# Patient Record
Sex: Female | Born: 1945 | Race: White | Hispanic: No | Smoking: Never smoker
Health system: Southern US, Community
[De-identification: ages and names within clinical notes are randomized; demographics above are authoritative.]

## PROBLEM LIST (undated history)

## (undated) DIAGNOSIS — I10 Essential (primary) hypertension: Secondary | ICD-10-CM

## (undated) HISTORY — PX: BREAST SURGERY: SHX581

---

## 2016-10-25 ENCOUNTER — Emergency Department (HOSPITAL_BASED_OUTPATIENT_CLINIC_OR_DEPARTMENT_OTHER)
Admission: EM | Admit: 2016-10-25 | Discharge: 2016-10-25 | Disposition: A | Discharge status: Home / Self Care | Payer: Medicare HMO | Attending: Emergency Medicine | Admitting: Emergency Medicine

## 2016-10-25 ENCOUNTER — Encounter (HOSPITAL_BASED_OUTPATIENT_CLINIC_OR_DEPARTMENT_OTHER): Payer: Self-pay | Admitting: Emergency Medicine

## 2016-10-25 DIAGNOSIS — R040 Epistaxis: Secondary | ICD-10-CM | POA: Insufficient documentation

## 2016-10-25 DIAGNOSIS — Z79899 Other long term (current) drug therapy: Secondary | ICD-10-CM | POA: Insufficient documentation

## 2016-10-25 MED ORDER — SILVER NITRATE-POT NITRATE 75-25 % EX MISC
CUTANEOUS | Status: AC
  Administered 2016-10-25: 2 via TOPICAL
  Filled 2016-10-25: qty 1

## 2016-10-25 MED ORDER — OXYMETAZOLINE HCL 0.05 % NA SOLN
1.0000 | Freq: Once | NASAL | Status: AC
  Administered 2016-10-25: 1 via NASAL
  Filled 2016-10-25: qty 15

## 2016-10-25 MED ORDER — SILVER NITRATE-POT NITRATE 75-25 % EX MISC
2.0000 | Freq: Once | CUTANEOUS | Status: AC
  Administered 2016-10-25: 2 via TOPICAL

## 2016-10-25 MED ORDER — SILVER NITRATE-POT NITRATE 75-25 % EX MISC
1.0000 | Freq: Once | CUTANEOUS | Status: AC
  Administered 2016-10-25: 1 via TOPICAL
  Filled 2016-10-25: qty 1

## 2016-10-25 MED ORDER — ONDANSETRON 4 MG PO TBDP
4.0000 mg | ORAL_TABLET | Freq: Once | ORAL | Status: AC
  Administered 2016-10-25: 4 mg via ORAL
  Filled 2016-10-25: qty 1

## 2016-10-25 NOTE — ED Provider Notes (Signed)
[facility] Provider Note   CSN: [id number] Arrival date & time: 10/25/16  0904     History   Chief Complaint Chief Complaint  Patient presents with  . Epistaxis    HPI Anna Page is a 71 y.o. female who presents to the emergency department with a chief complaint of sudden onset left sided epistaxis that began this morning when she awoke around 7:50. No recent history of similar symptoms. She denies dizziness, lightheadedness, or recent nasal congestion. She reports she attempted to lay flat on her back and blow her nose to get the bleeding to stop, but states she continued to feel blood dripping down the back of her throat. She called her neighbor who is a nurse who came over and told her to pinch her nose and leg forward. She states that the stopped dripping from the front of the nose, but she continued to feel blood running down the back of her throat. She states that she began to feel nauseated and vomited a large amount of bright red blood with some clots so she came to the emergency department for evaluation. She is not on blood thinners.   The history is provided by the patient. No language interpreter was used.    History reviewed. No pertinent past medical history.  There are no active problems to display for this patient.   History reviewed. No pertinent surgical history.  OB History    No data available       Home Medications    Prior to Admission medications   Medication Sig Start Date End Date Taking? Authorizing Provider  Docusate Calcium (STOOL SOFTENER PO) Take by mouth.   Yes [provider]  HYDROCHLOROTHIAZIDE PO Take by mouth.   Yes [provider]  IRON PO Take by mouth.   Yes [provider]    Family History History reviewed. No pertinent family history.  Social History Social History  Substance Use Topics  . Smoking status: Never Smoker  . Smokeless tobacco: Never Used  . Alcohol use No      Allergies   Patient has no known allergies.   Review of Systems Review of Systems  HENT: Positive for nosebleeds. Negative for congestion.   Gastrointestinal: Positive for nausea.  Neurological: Negative for dizziness and light-headedness.   Physical Exam Updated Vital Signs BP (!) 157/86 (BP Location: Right Arm)   Pulse 71   Temp 97.7 F (36.5 C) (Oral)   Resp 18   Ht  (1.549 m)   Wt 72.1 kg (159 lb)   SpO2 99%   BMI 30.04 kg/m   Physical Exam  Constitutional: No distress.  HENT:  Head: Normocephalic.  Nose: No nose lacerations, septal deviation or nasal septal hematoma. Epistaxis is observed.  Mouth/Throat: Uvula is midline, oropharynx is clear and moist and mucous membranes are normal.  No gross blood noted in the posterior oropharynx.  Eyes: Conjunctivae are normal.  Neck: Neck supple.  Cardiovascular: Normal rate and regular rhythm.  Exam reveals no gallop and no friction rub.   No murmur heard. Pulmonary/Chest: Effort normal. No respiratory distress.  Abdominal: Soft. She exhibits no distension.  Neurological: She is alert.  Skin: Skin is warm. No rash noted.  Psychiatric: Her behavior is normal.  Nursing note and vitals reviewed.  ED Treatments / Results  Labs (all labs ordered are listed, but only abnormal results are displayed) Labs Reviewed - No data to display  EKG  EKG Interpretation None  Radiology No results found.  Procedures .Epistaxis Management Date/Time: 10/25/2016 10:14 AM Performed by: Lilian Kapur, Ghassan Coggeshall A Authorized by: Frederik Pear A   Consent:    Consent obtained:  Verbal   Consent given by:  Patient   Risks discussed:  Bleeding, nasal injury, pain and infection   Alternatives discussed:  No treatment Anesthesia (see MAR for exact dosages):    Anesthesia method:  None Procedure details:    Treatment site:  L anterior   Treatment method:  Silver nitrate   Treatment complexity:  Limited   Treatment episode:  initial   Post-procedure details:    Assessment:  Bleeding stopped   Patient tolerance of procedure:  Tolerated well, no immediate complications   (including critical care time)  Medications Ordered in ED Medications  ondansetron (ZOFRAN-ODT) disintegrating tablet 4 mg (4 mg Oral Given 10/25/16 0930)  silver nitrate applicators applicator 1 Stick (1 Stick Topical Given by Other 10/25/16 1000)  oxymetazoline (AFRIN) 0.05 % nasal spray 1 spray (1 spray Each Nare Given by Other 10/25/16 1000)  silver nitrate applicators applicator 2 Stick (2 Sticks Topical Given by Other 10/25/16 1001)     Initial Impression / Assessment and Plan / ED Course  I have reviewed the triage vital signs and the nursing notes.  Pertinent labs & imaging results that were available during my care of the patient were reviewed by me and considered in my medical decision making (see chart for details).     71 year old female presenting with left-sided anterior epistaxis, which is still actively bleeding. The patient was instructed to lean forward blow her nose, and Afrin was squirted in the the left nare. After applying to her nose, she continued to bleed. On re-visualization, a small bleeding vessel was noted along the anterior left septum. Silver nitrate was used to cauterize the vessel. Hemostasis was achieved after a second stick of silver nitrate was used followed by another squirt of Afrin. The patient reports she was feeling nauseated and was given Zofran, which improved her symptoms. She was observed for 15 minutes following the procedure to ensure she did not begin to bleed again. Strict return precautions given. No acute distress. The patient is safe for discharge at this time.  Final Clinical Impressions(s) / ED Diagnoses   Final diagnoses:  Acute anterior epistaxis    New Prescriptions New Prescriptions   No medications on file     Barkley Boards, PA-C 10/25/16 1022    Long, Arlyss Repress, MD 10/25/16  1135

## 2016-10-25 NOTE — Discharge Instructions (Signed)
Use a clean towel or tissue to pinch your nostrils under the bony part of your nose. After 10 minutes, let go of your nose and see if bleeding starts again. Do not release pressure before that time. If there is still bleeding, repeat the pinching and holding for 10 minutes until the bleeding stops. If you nose continues to bleed, you can squirt Afrin into the nose, pinch the nose, and lean forward. If you lean back, it can cause the blood to drain down the back of the throat and upset your stomach.  Try to avoid laying down for the next few hours. You can also try using a humidifier at home to avoid getting the skin of the inside of the nose to dry, which can increase your risk of getting a nose bleed.   If you develop another nosebleed, please return to the Emergency Department for re-evaluation.

## 2016-10-25 NOTE — ED Notes (Signed)
ED Provider at bedside. 

## 2016-10-25 NOTE — ED Triage Notes (Signed)
Reports nose bleed which began at 0750 this morning.  Denies previous history of nose bleeds.  No active bleeding at present.

## 2016-10-25 NOTE — ED Notes (Signed)
Patient given sprite per EDP

## 2019-01-05 ENCOUNTER — Emergency Department (HOSPITAL_BASED_OUTPATIENT_CLINIC_OR_DEPARTMENT_OTHER): Payer: Medicare HMO

## 2019-01-05 ENCOUNTER — Other Ambulatory Visit: Payer: Self-pay

## 2019-01-05 ENCOUNTER — Encounter (HOSPITAL_BASED_OUTPATIENT_CLINIC_OR_DEPARTMENT_OTHER): Payer: Self-pay

## 2019-01-05 ENCOUNTER — Inpatient Hospital Stay (HOSPITAL_BASED_OUTPATIENT_CLINIC_OR_DEPARTMENT_OTHER)
Admission: EM | Admit: 2019-01-05 | Discharge: 2019-01-10 | DRG: 177 | Disposition: A | Discharge status: Home / Self Care | Payer: Medicare HMO | Attending: Family Medicine | Admitting: Family Medicine

## 2019-01-05 DIAGNOSIS — R748 Abnormal levels of other serum enzymes: Secondary | ICD-10-CM | POA: Diagnosis not present

## 2019-01-05 DIAGNOSIS — R739 Hyperglycemia, unspecified: Secondary | ICD-10-CM | POA: Diagnosis not present

## 2019-01-05 DIAGNOSIS — J1289 Other viral pneumonia: Secondary | ICD-10-CM | POA: Diagnosis present

## 2019-01-05 DIAGNOSIS — R0602 Shortness of breath: Secondary | ICD-10-CM | POA: Diagnosis present

## 2019-01-05 DIAGNOSIS — T502X5A Adverse effect of carbonic-anhydrase inhibitors, benzothiadiazides and other diuretics, initial encounter: Secondary | ICD-10-CM | POA: Diagnosis present

## 2019-01-05 DIAGNOSIS — R0902 Hypoxemia: Secondary | ICD-10-CM

## 2019-01-05 DIAGNOSIS — Z79899 Other long term (current) drug therapy: Secondary | ICD-10-CM

## 2019-01-05 DIAGNOSIS — Z9981 Dependence on supplemental oxygen: Secondary | ICD-10-CM

## 2019-01-05 DIAGNOSIS — J9601 Acute respiratory failure with hypoxia: Secondary | ICD-10-CM | POA: Diagnosis present

## 2019-01-05 DIAGNOSIS — I2609 Other pulmonary embolism with acute cor pulmonale: Secondary | ICD-10-CM | POA: Diagnosis present

## 2019-01-05 DIAGNOSIS — I2602 Saddle embolus of pulmonary artery with acute cor pulmonale: Secondary | ICD-10-CM | POA: Diagnosis not present

## 2019-01-05 DIAGNOSIS — Z8249 Family history of ischemic heart disease and other diseases of the circulatory system: Secondary | ICD-10-CM

## 2019-01-05 DIAGNOSIS — T380X5A Adverse effect of glucocorticoids and synthetic analogues, initial encounter: Secondary | ICD-10-CM | POA: Diagnosis not present

## 2019-01-05 DIAGNOSIS — U071 COVID-19: Secondary | ICD-10-CM | POA: Diagnosis present

## 2019-01-05 DIAGNOSIS — J069 Acute upper respiratory infection, unspecified: Secondary | ICD-10-CM

## 2019-01-05 DIAGNOSIS — J188 Other pneumonia, unspecified organism: Secondary | ICD-10-CM | POA: Diagnosis present

## 2019-01-05 DIAGNOSIS — I2699 Other pulmonary embolism without acute cor pulmonale: Secondary | ICD-10-CM | POA: Diagnosis present

## 2019-01-05 DIAGNOSIS — E876 Hypokalemia: Secondary | ICD-10-CM | POA: Diagnosis present

## 2019-01-05 DIAGNOSIS — I10 Essential (primary) hypertension: Secondary | ICD-10-CM | POA: Diagnosis present

## 2019-01-05 DIAGNOSIS — R001 Bradycardia, unspecified: Secondary | ICD-10-CM | POA: Diagnosis not present

## 2019-01-05 DIAGNOSIS — R7303 Prediabetes: Secondary | ICD-10-CM | POA: Diagnosis present

## 2019-01-05 DIAGNOSIS — J189 Pneumonia, unspecified organism: Secondary | ICD-10-CM

## 2019-01-05 DIAGNOSIS — T50905A Adverse effect of unspecified drugs, medicaments and biological substances, initial encounter: Secondary | ICD-10-CM | POA: Diagnosis not present

## 2019-01-05 DIAGNOSIS — R7989 Other specified abnormal findings of blood chemistry: Secondary | ICD-10-CM | POA: Diagnosis not present

## 2019-01-05 DIAGNOSIS — A0839 Other viral enteritis: Secondary | ICD-10-CM | POA: Diagnosis present

## 2019-01-05 HISTORY — DX: Essential (primary) hypertension: I10

## 2019-01-05 LAB — CBC WITH DIFFERENTIAL/PLATELET
Abs Immature Granulocytes: 0.06 10*3/uL (ref 0.00–0.07)
Basophils Absolute: 0 10*3/uL (ref 0.0–0.1)
Basophils Relative: 0 %
Eosinophils Absolute: 0 10*3/uL (ref 0.0–0.5)
Eosinophils Relative: 0 %
HCT: 39.9 % (ref 36.0–46.0)
Hemoglobin: 13.4 g/dL (ref 12.0–15.0)
Immature Granulocytes: 1 %
Lymphocytes Relative: 7 %
Lymphs Abs: 0.7 10*3/uL (ref 0.7–4.0)
MCH: 28.9 pg (ref 26.0–34.0)
MCHC: 33.6 g/dL (ref 30.0–36.0)
MCV: 86.2 fL (ref 80.0–100.0)
Monocytes Absolute: 0.9 10*3/uL (ref 0.1–1.0)
Monocytes Relative: 9 %
Neutro Abs: 9.1 10*3/uL — ABNORMAL HIGH (ref 1.7–7.7)
Neutrophils Relative %: 83 %
Platelets: 506 10*3/uL — ABNORMAL HIGH (ref 150–400)
RBC: 4.63 MIL/uL (ref 3.87–5.11)
RDW: 12.3 % (ref 11.5–15.5)
WBC: 10.8 10*3/uL — ABNORMAL HIGH (ref 4.0–10.5)
nRBC: 0 % (ref 0.0–0.2)

## 2019-01-05 LAB — RESPIRATORY PANEL BY RT PCR (FLU A&B, COVID)
Influenza A by PCR: NEGATIVE
Influenza B by PCR: NEGATIVE
SARS Coronavirus 2 by RT PCR: POSITIVE — AB

## 2019-01-05 LAB — COMPREHENSIVE METABOLIC PANEL
ALT: 31 U/L (ref 0–44)
AST: 48 U/L — ABNORMAL HIGH (ref 15–41)
Albumin: 3.2 g/dL — ABNORMAL LOW (ref 3.5–5.0)
Alkaline Phosphatase: 110 U/L (ref 38–126)
Anion gap: 16 — ABNORMAL HIGH (ref 5–15)
BUN: 18 mg/dL (ref 8–23)
CO2: 22 mmol/L (ref 22–32)
Calcium: 8.8 mg/dL — ABNORMAL LOW (ref 8.9–10.3)
Chloride: 97 mmol/L — ABNORMAL LOW (ref 98–111)
Creatinine, Ser: 0.88 mg/dL (ref 0.44–1.00)
GFR calc Af Amer: >60 mL/min (ref >60)
GFR calc non Af Amer: >60 mL/min (ref >60)
Glucose, Bld: 113 mg/dL — ABNORMAL HIGH (ref 70–99)
Potassium: 3 mmol/L — ABNORMAL LOW (ref 3.5–5.1)
Sodium: 135 mmol/L (ref 135–145)
Total Bilirubin: 1 mg/dL (ref 0.3–1.2)
Total Protein: 7.6 g/dL (ref 6.5–8.1)

## 2019-01-05 LAB — C-REACTIVE PROTEIN: CRP: 29.4 mg/dL — ABNORMAL HIGH (ref <1.0)

## 2019-01-05 LAB — D-DIMER, QUANTITATIVE: D-Dimer, Quant: 2.71 ug/mL-FEU — ABNORMAL HIGH (ref 0.00–0.50)

## 2019-01-05 LAB — PROCALCITONIN: Procalcitonin: <0.1 ng/mL

## 2019-01-05 LAB — LACTATE DEHYDROGENASE: LDH: 382 U/L — ABNORMAL HIGH (ref 98–192)

## 2019-01-05 LAB — FERRITIN: Ferritin: 657 ng/mL — ABNORMAL HIGH (ref 11–307)

## 2019-01-05 LAB — FIBRINOGEN: Fibrinogen: >800 mg/dL — ABNORMAL HIGH (ref 210–475)

## 2019-01-05 LAB — LACTIC ACID, PLASMA: Lactic Acid, Venous: 1.5 mmol/L (ref 0.5–1.9)

## 2019-01-05 LAB — SARS CORONAVIRUS 2 AG (30 MIN TAT): SARS Coronavirus 2 Ag: NEGATIVE

## 2019-01-05 LAB — TRIGLYCERIDES: Triglycerides: 103 mg/dL (ref <150)

## 2019-01-05 MED ORDER — DEXAMETHASONE SODIUM PHOSPHATE 10 MG/ML IJ SOLN
10.0000 mg | Freq: Once | INTRAMUSCULAR | Status: AC
  Administered 2019-01-05: 10 mg via INTRAVENOUS
  Filled 2019-01-05: qty 1

## 2019-01-05 MED ORDER — POTASSIUM CHLORIDE CRYS ER 20 MEQ PO TBCR
40.0000 meq | EXTENDED_RELEASE_TABLET | Freq: Once | ORAL | Status: AC
  Administered 2019-01-05: 40 meq via ORAL
  Filled 2019-01-05: qty 2

## 2019-01-05 NOTE — ED Notes (Signed)
Pt daughter brought pt personal care items-toothbrush, toothpaste and clean underwear. Updated pt. Pt denies needs.

## 2019-01-05 NOTE — ED Triage Notes (Addendum)
Pt c/o flu like sx x 2 weeks-NAD/constant dry cough-to tx room in w/c

## 2019-01-05 NOTE — ED Notes (Signed)
Pt assisted up to bed side commode. Tolerated well. No acute distress noted. Updated on POC.

## 2019-01-05 NOTE — ED Notes (Signed)
Pt assisted to [place], tolerated well, but O2 sats noted to drop to 90% with activity

## 2019-01-05 NOTE — ED Provider Notes (Signed)
[facility] EMERGENCY DEPARTMENT Provider Note   CSN: [id number] Arrival date & time: 01/05/19  1348     History Chief Complaint  Patient presents with  . Cough    Anna Page is a 73 y.o. female who presents emergency department with chief complaint of flulike symptoms and shortness of breath.  Patient has had 1 week of flulike symptoms including cough, fevers, chills, diarrhea.  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  She has not been tested.  Patient states that she is feeling extremely short of breath today.  She was found to be 81% on room air by a respiratory therapist  HPI     Past Medical History:  Diagnosis Date  . Hypertension     Patient Active Problem List   Diagnosis Date Noted  . Multifocal pneumonia 01/05/2019    History reviewed. No pertinent surgical history.   OB History   No obstetric history on file.     No family history on file.  Social History   Tobacco Use  . Smoking status: Never Smoker  . Smokeless tobacco: Never Used  Substance Use Topics  . Alcohol use: No  . Drug use: No    Home Medications Prior to Admission medications   Medication Sig Start Date End Date Taking? Authorizing Provider  Docusate Calcium (STOOL SOFTENER PO) Take by mouth.    [provider]  HYDROCHLOROTHIAZIDE PO Take by mouth.    [provider]  IRON PO Take by mouth.    [provider]    Allergies    Patient has no known allergies.  Review of Systems   Review of Systems Ten systems reviewed and are negative for acute change, except as noted in the HPI.   Physical Exam Updated Vital Signs BP 111/61   Pulse 85   Temp 99.5 F (37.5 C) (Oral)   Resp (!) 22   Ht 5\' 1"  (1.549 m)   Wt 69.9 kg   SpO2 97%   BMI 29.10 kg/m   Physical Exam Vitals and nursing note reviewed.  Constitutional:      General: She is not in acute distress.    Appearance: She is well-developed. She  is not diaphoretic.  HENT:     Head: Normocephalic and atraumatic.  Eyes:     General: No scleral icterus.    Conjunctiva/sclera: Conjunctivae normal.  Cardiovascular:     Rate and Rhythm: Normal rate and regular rhythm.     Heart sounds: Normal heart sounds. No murmur. No friction rub. No gallop.   Pulmonary:     Effort: Tachypnea present. No respiratory distress.     Breath sounds: Normal breath sounds.  Abdominal:     General: Bowel sounds are normal. There is no distension.     Palpations: Abdomen is soft. There is no mass.     Tenderness: There is no abdominal tenderness. There is no guarding.  Musculoskeletal:     Cervical back: Normal range of motion.  Skin:    General: Skin is warm and dry.  Neurological:     Mental Status: She is alert and oriented to person, place, and time.  Psychiatric:        Behavior: Behavior normal.     ED Results / Procedures / Treatments   Labs (all labs ordered are listed, but only abnormal results are displayed) Labs Reviewed  CBC WITH DIFFERENTIAL/PLATELET - Abnormal; Notable for the following components:  Result Value   WBC 10.8 (*)    Platelets 506 (*)    Neutro Abs 9.1 (*)    All other components within normal limits  COMPREHENSIVE METABOLIC PANEL - Abnormal; Notable for the following components:   Potassium 3.0 (*)    Chloride 97 (*)    Glucose, Bld 113 (*)    Calcium 8.8 (*)    Albumin 3.2 (*)    AST 48 (*)    Anion gap 16 (*)    All other components within normal limits  D-DIMER, QUANTITATIVE (NOT AT [facility]) - Abnormal; Notable for the following components:   D-Dimer, Quant 2.71 (*)    All other components within normal limits  SARS CORONAVIRUS 2 AG (30 MIN TAT)  CULTURE, BLOOD (ROUTINE X 2)  CULTURE, BLOOD (ROUTINE X 2)  RESPIRATORY PANEL BY RT PCR (FLU A&B, COVID)  LACTIC ACID, PLASMA  PROCALCITONIN  LACTATE DEHYDROGENASE  FERRITIN  TRIGLYCERIDES  FIBRINOGEN  C-REACTIVE PROTEIN  C-REACTIVE PROTEIN  C-REACTIVE  PROTEIN  D-DIMER, QUANTITATIVE (NOT AT [facility])  FERRITIN  FERRITIN  CBC  COMPREHENSIVE METABOLIC PANEL    EKG None  Radiology DG Chest Port 1 View  Result Date: 01/05/2019 CLINICAL DATA:  Shortness of breath.  Flu like symptoms.  Hypoxia. EXAM: PORTABLE CHEST 1 VIEW COMPARISON:  None. FINDINGS: The cardiomediastinal silhouette is normal. Bilateral patchy pulmonary infiltrates, most prominent in the right base. No other acute abnormalities are identified. IMPRESSION: Bilateral pulmonary infiltrates, most prominent the right base, consistent with pneumonia. Atypical infections should be considered given the appearance. Electronically Signed   By: Gerome Samavid  Williams III M.D   On: 01/05/2019 14:43    Procedures .Critical Care Performed by: Arthor CaptainHarris, Avian Konigsberg, PA-C Authorized by: Arthor CaptainHarris, Katia Hannen, PA-C   Critical care provider statement:    Critical care time (minutes):  50   Critical care time was exclusive of:  Separately billable procedures and treating other patients   Critical care was necessary to treat or prevent imminent or life-threatening deterioration of the following conditions:  Respiratory failure   Critical care was time spent personally by me on the following activities:  Discussions with consultants, evaluation of patient's response to treatment, examination of patient, ordering and performing treatments and interventions, ordering and review of laboratory studies, ordering and review of radiographic studies, pulse oximetry, re-evaluation of patient's condition, obtaining history from patient or surrogate and review of old charts   (including critical care time)  Medications Ordered in ED Medications  dexamethasone (DECADRON) injection 10 mg (has no administration in time range)  potassium chloride SA (KLOR-CON) CR tablet 40 mEq (has no administration in time range)    ED Course  I have reviewed the triage vital signs and the nursing notes.  Pertinent labs & imaging results  that were available during my care of the patient were reviewed by me and considered in my medical decision making (see chart for details).  Clinical Course as of Jan 05 1720  Wed Jan 05, 2019  1522 WBC(!): 10.8 [AH]    Clinical Course User Index [AH] Arthor CaptainHarris, Nea Gittens, PA-C   MDM Rules/Calculators/A&P                        CC:sob/ flulike sxs VS:  Vitals:   01/05/19 1410 01/05/19 1500 01/05/19 1511 01/05/19 1605  BP:    111/61  Pulse:  75  85  Resp:  (!) 23  (!) 22  Temp:      TempSrc:  SpO2: 95% 99% 95% 97%  Weight:      Height:       HD:QQIWLNL is gathered by patient and EMR. DDX:The emergent differential diagnosis for shortness of breath includes, but is not limited to, Pulmonary edema, bronchoconstriction, Pneumonia, Pulmonary embolism, Pneumotherax/ Hemothorax, Dysrythmia, ACS.  Labs: I reviewed the labs which show elevated d dimer,  Leukocytosis and elevated platelets, CMP shows significant hypokalemia treated with  Imaging: I personally reviewed the images (1 V cxr) which show(s) lateral pulmonary infiltrates on my examination. EKG:Marland Kitchen MDM: Patient with acute hypoxic respiratory failure requiring 6 L of oxygen to maintain her oxygen saturations above 90%.  Patient appears to have a multifocal patchy pneumonia likely viral in origin.  Her initial coronavirus test is negative however I did have high suspicion that this is secondary to COVID-19 infection.  She will be admitted to the hospitalist service at [facility] long on the telemetry floor unless secondary testing returned positive for Covid. Patient disposition: Admit Patient condition: . The patient appears reasonably stabilized for admission considering the current resources, flow, and capabilities available in the ED at this time, and I doubt any other [facility] requiring further screening and/or treatment in the ED prior to admission.   Anna Page was evaluated in Emergency Department on 01/05/2019 for the symptoms  described in the history of present illness. She was evaluated in the context of the global COVID-19 pandemic, which necessitated consideration that the patient might be at risk for infection with the SARS-CoV-2 virus that causes COVID-19. Institutional protocols and algorithms that pertain to the evaluation of patients at risk for COVID-19 are in a state of rapid change based on information released by regulatory bodies including the CDC and federal and state organizations. These policies and algorithms were followed during the patient's care in the ED.  Final Clinical Impression(s) / ED Diagnoses Final diagnoses:  Acute respiratory failure with hypoxia [facility])  Multifocal pneumonia  Hypokalemia    Rx / DC Orders ED Discharge Orders    None       Arthor Captain, PA-C 01/05/19 1721    Long, Arlyss Repress, MD 01/05/19 [place]

## 2019-01-05 NOTE — ED Notes (Signed)
Spoke with pts daugther on the phone. States that she is going to bring some clothing up to the hospital for her mother.  Updated pt on POC

## 2019-01-05 NOTE — Progress Notes (Signed)
73 year old female with history of hypertension who presents to emergency department at [facility] with flulike symptoms, shortness of breath for 1 week.  She also reported fever, chills, diarrhea.  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 When she presented there, she was saturating at 81% on room air.  She had to be put on 6 L of oxygen. Point-of-care Covid test was negative.  PCR test has been sent and is pending.  This is most likely Covid.  Chest x-ray showed bilateral infiltrates. Since we do not have a clear diagnosis yet, will transfer the patient to [facility] on telemetry bed .  We can transfer the patient to [facility] after we get formal positive Covid test.  Management will depend upon Covid test result.  Continue supplemental oxygen for now.  Continue to monitor inflammatory markers.

## 2019-01-05 NOTE — ED Notes (Signed)
Pt updated on POC. No acute distress noted. Pt repositioned in bed, given applesauce to eat. Denies needs or questions at this time.

## 2019-01-06 ENCOUNTER — Inpatient Hospital Stay (HOSPITAL_COMMUNITY): Payer: Medicare HMO

## 2019-01-06 ENCOUNTER — Encounter (HOSPITAL_COMMUNITY): Payer: Self-pay | Admitting: Internal Medicine

## 2019-01-06 DIAGNOSIS — J9601 Acute respiratory failure with hypoxia: Secondary | ICD-10-CM

## 2019-01-06 DIAGNOSIS — R739 Hyperglycemia, unspecified: Secondary | ICD-10-CM

## 2019-01-06 DIAGNOSIS — R7989 Other specified abnormal findings of blood chemistry: Secondary | ICD-10-CM

## 2019-01-06 DIAGNOSIS — J189 Pneumonia, unspecified organism: Secondary | ICD-10-CM

## 2019-01-06 DIAGNOSIS — J069 Acute upper respiratory infection, unspecified: Secondary | ICD-10-CM

## 2019-01-06 DIAGNOSIS — I2699 Other pulmonary embolism without acute cor pulmonale: Secondary | ICD-10-CM | POA: Diagnosis present

## 2019-01-06 DIAGNOSIS — R748 Abnormal levels of other serum enzymes: Secondary | ICD-10-CM

## 2019-01-06 DIAGNOSIS — E876 Hypokalemia: Secondary | ICD-10-CM

## 2019-01-06 DIAGNOSIS — T50905A Adverse effect of unspecified drugs, medicaments and biological substances, initial encounter: Secondary | ICD-10-CM

## 2019-01-06 DIAGNOSIS — R001 Bradycardia, unspecified: Secondary | ICD-10-CM

## 2019-01-06 DIAGNOSIS — I10 Essential (primary) hypertension: Secondary | ICD-10-CM

## 2019-01-06 DIAGNOSIS — U071 COVID-19: Principal | ICD-10-CM

## 2019-01-06 LAB — COMPREHENSIVE METABOLIC PANEL
ALT: 34 U/L (ref 0–44)
AST: 41 U/L (ref 15–41)
Albumin: 2.7 g/dL — ABNORMAL LOW (ref 3.5–5.0)
Alkaline Phosphatase: 107 U/L (ref 38–126)
Anion gap: 15 (ref 5–15)
BUN: 24 mg/dL — ABNORMAL HIGH (ref 8–23)
CO2: 22 mmol/L (ref 22–32)
Calcium: 8.6 mg/dL — ABNORMAL LOW (ref 8.9–10.3)
Chloride: 101 mmol/L (ref 98–111)
Creatinine, Ser: 0.96 mg/dL (ref 0.44–1.00)
GFR calc Af Amer: >60 mL/min (ref >60)
GFR calc non Af Amer: 60 mL/min — ABNORMAL LOW (ref >60)
Glucose, Bld: 173 mg/dL — ABNORMAL HIGH (ref 70–99)
Potassium: 4.2 mmol/L (ref 3.5–5.1)
Sodium: 138 mmol/L (ref 135–145)
Total Bilirubin: 1.1 mg/dL (ref 0.3–1.2)
Total Protein: 6.8 g/dL (ref 6.5–8.1)

## 2019-01-06 LAB — HEMOGLOBIN A1C
Hgb A1c MFr Bld: 6.4 % — ABNORMAL HIGH (ref 4.8–5.6)
Mean Plasma Glucose: 136.98 mg/dL

## 2019-01-06 LAB — PROTIME-INR
INR: 1.3 — ABNORMAL HIGH (ref 0.8–1.2)
Prothrombin Time: 15.7 seconds — ABNORMAL HIGH (ref 11.4–15.2)

## 2019-01-06 LAB — BRAIN NATRIURETIC PEPTIDE: B Natriuretic Peptide: 108.6 pg/mL — ABNORMAL HIGH (ref 0.0–100.0)

## 2019-01-06 LAB — CBC
HCT: 38.2 % (ref 36.0–46.0)
Hemoglobin: 12.3 g/dL (ref 12.0–15.0)
MCH: 29 pg (ref 26.0–34.0)
MCHC: 32.2 g/dL (ref 30.0–36.0)
MCV: 90.1 fL (ref 80.0–100.0)
Platelets: 362 10*3/uL (ref 150–400)
RBC: 4.24 MIL/uL (ref 3.87–5.11)
RDW: 12.6 % (ref 11.5–15.5)
WBC: 7.5 10*3/uL (ref 4.0–10.5)
nRBC: 0 % (ref 0.0–0.2)

## 2019-01-06 LAB — C-REACTIVE PROTEIN: CRP: 33.5 mg/dL — ABNORMAL HIGH (ref <1.0)

## 2019-01-06 LAB — FERRITIN: Ferritin: 589 ng/mL — ABNORMAL HIGH (ref 11–307)

## 2019-01-06 LAB — APTT: aPTT: 35 seconds (ref 24–36)

## 2019-01-06 LAB — D-DIMER, QUANTITATIVE: D-Dimer, Quant: 13.32 ug/mL-FEU — ABNORMAL HIGH (ref 0.00–0.50)

## 2019-01-06 LAB — TROPONIN I (HIGH SENSITIVITY): Troponin I (High Sensitivity): 5 ng/L (ref <18)

## 2019-01-06 LAB — ABO/RH: ABO/RH(D): A POS

## 2019-01-06 MED ORDER — METHYLPREDNISOLONE SODIUM SUCC 40 MG IJ SOLR: 40.0000 mg | Freq: Two times a day (BID) | INTRAMUSCULAR | Status: DC

## 2019-01-06 MED ORDER — GUAIFENESIN-DM 100-10 MG/5ML PO SYRP
5.0000 mL | ORAL_SOLUTION | ORAL | Status: DC | PRN
  Administered 2019-01-06: 5 mL via ORAL
  Filled 2019-01-06: qty 10

## 2019-01-06 MED ORDER — ONDANSETRON HCL 4 MG/2ML IJ SOLN: 4.0000 mg | Freq: Four times a day (QID) | INTRAMUSCULAR | Status: DC | PRN

## 2019-01-06 MED ORDER — SODIUM CHLORIDE 0.9 % IV SOLN
200.0000 mg | Freq: Once | INTRAVENOUS | Status: AC
  Administered 2019-01-06: 200 mg via INTRAVENOUS
  Filled 2019-01-06: qty 200

## 2019-01-06 MED ORDER — IOHEXOL 350 MG/ML SOLN
75.0000 mL | Freq: Once | INTRAVENOUS | Status: AC | PRN
  Administered 2019-01-06: 75 mL via INTRAVENOUS

## 2019-01-06 MED ORDER — ENOXAPARIN SODIUM 40 MG/0.4ML ~~LOC~~ SOLN
40.0000 mg | Freq: Two times a day (BID) | SUBCUTANEOUS | Status: DC
  Administered 2019-01-06: 40 mg via SUBCUTANEOUS
  Filled 2019-01-06 (×2): qty 0.4

## 2019-01-06 MED ORDER — HEPARIN (PORCINE) 25000 UT/250ML-% IV SOLN
1100.0000 [IU]/h | INTRAVENOUS | Status: DC
  Administered 2019-01-06: 1000 [IU]/h via INTRAVENOUS
  Administered 2019-01-07 – 2019-01-08 (×4): 1100 [IU]/h via INTRAVENOUS
  Filled 2019-01-06 (×3): qty 250

## 2019-01-06 MED ORDER — PANTOPRAZOLE SODIUM 40 MG PO TBEC
40.0000 mg | DELAYED_RELEASE_TABLET | Freq: Every day | ORAL | Status: DC
  Administered 2019-01-07 – 2019-01-10 (×4): 40 mg via ORAL
  Filled 2019-01-06 (×4): qty 1

## 2019-01-06 MED ORDER — METHYLPREDNISOLONE SODIUM SUCC 40 MG IJ SOLR
40.0000 mg | Freq: Two times a day (BID) | INTRAMUSCULAR | Status: DC
  Administered 2019-01-06 – 2019-01-09 (×6): 40 mg via INTRAVENOUS
  Filled 2019-01-06 (×7): qty 1

## 2019-01-06 MED ORDER — ORAL CARE MOUTH RINSE
15.0000 mL | Freq: Two times a day (BID) | OROMUCOSAL | Status: DC
  Administered 2019-01-06 – 2019-01-10 (×8): 15 mL via OROMUCOSAL

## 2019-01-06 MED ORDER — ENOXAPARIN SODIUM 40 MG/0.4ML ~~LOC~~ SOLN: 40.0000 mg | SUBCUTANEOUS | Status: DC

## 2019-01-06 MED ORDER — SODIUM CHLORIDE 0.9 % IV SOLN
100.0000 mg | Freq: Every day | INTRAVENOUS | Status: AC
  Administered 2019-01-07 – 2019-01-10 (×4): 100 mg via INTRAVENOUS
  Filled 2019-01-06 (×3): qty 20

## 2019-01-06 MED ORDER — TOCILIZUMAB 400 MG/20ML IV SOLN
600.0000 mg | Freq: Once | INTRAVENOUS | Status: AC
  Administered 2019-01-06: 600 mg via INTRAVENOUS
  Filled 2019-01-06: qty 20

## 2019-01-06 MED ORDER — ONDANSETRON HCL 4 MG PO TABS: 4.0000 mg | ORAL_TABLET | Freq: Four times a day (QID) | ORAL | Status: DC | PRN

## 2019-01-06 MED ORDER — DEXAMETHASONE SODIUM PHOSPHATE 10 MG/ML IJ SOLN
6.0000 mg | INTRAMUSCULAR | Status: DC
  Administered 2019-01-06: 6 mg via INTRAVENOUS
  Filled 2019-01-06: qty 1

## 2019-01-06 MED ORDER — ACETAMINOPHEN 650 MG RE SUPP: 650.0000 mg | Freq: Four times a day (QID) | RECTAL | Status: DC | PRN

## 2019-01-06 MED ORDER — ACETAMINOPHEN 325 MG PO TABS: 650.0000 mg | ORAL_TABLET | Freq: Four times a day (QID) | ORAL | Status: DC | PRN

## 2019-01-06 MED ORDER — HEPARIN BOLUS VIA INFUSION
2000.0000 [IU] | Freq: Once | INTRAVENOUS | Status: AC
  Administered 2019-01-06: 2000 [IU] via INTRAVENOUS
  Filled 2019-01-06: qty 2000

## 2019-01-06 NOTE — Progress Notes (Signed)
1745- Patient arrived from [place]. AXOX4. On 6 liters  per Nasal canula. No distress noted. Fine crackles noted bilateral lungs. Oriented to room and introduced to staff. Patient daughter Kieth Brightly called shortly after transfer. Updates given about patient condition at time of arrival. Safety measures in place. Call bell and items within reach. ICE chip and Ice water provided. Patient up in bed eating dinner. RN sets up the meal tray and cut the chicken per patient request. MD contacted about CT angio order that came from the other hospital. Patient to have CT done tonight per MD. CT expecting patient tonight. Will continue to monitor.

## 2019-01-06 NOTE — Progress Notes (Signed)
This nurse updated patient emergency contact Kieth Brightly on patient status. Kieth Brightly inquired about bringing patients clothes in tomorrow and taking dirty clothes home to clean them. This nurse updated Kieth Brightly about [place] policy in regards to personal belongings and will update family member as well. All questions answered at this time.

## 2019-01-06 NOTE — ED Notes (Signed)
[facility] here to transport pt. Pt stable. Report given to Augustine Radar RN.

## 2019-01-06 NOTE — Progress Notes (Signed)
Notified by radiology that CTA chest is positive for PE involving RML and RLL pulm arteries with RV/LV slightly above 0.9. She is hemodynamically stable, not in acute distress, and normal HS troponin today. Plan to start IV heparin, check BNP, continue monitoring. Discussed with RN.

## 2019-01-06 NOTE — Progress Notes (Signed)
Pharmacy: Remdesivir   Patient is a 73 y.o. female with COVID.  Pharmacy has been consulted for remdesivir dosing.   -CXR shows "Bilateral pulmonary infiltrates, most prominent the right base, consistent with pneumonia. Atypical infections should be considered given the appearance."  -Pt requiring supplemental oxygen (Yes, 6L [place])  -ALT 31     A/P:  - Patient meets criteria for remdesivir. Will initiate remdesivir 200 mg once followed by 100 mg daily x 4 days.  - Daily CMET while on remdesivir - Will f/u pt's ALT and clinical condition

## 2019-01-06 NOTE — Progress Notes (Signed)
ANTICOAGULATION CONSULT NOTE - Initial Consult  Pharmacy Consult for Heparin Indication: pulmonary embolus  Allergies  Allergen Reactions  . Prednisone Nausea Only    lightheaded    Patient Measurements: Height: 5\' 1"  (154.9 cm) Weight: 148 lb 9.4 oz (67.4 kg) IBW/kg (Calculated) : 47.8 Heparin Dosing Weight: 62 kg  Vital Signs: Temp: 98.3 F (36.8 C) (12/17 1641) Temp Source: Oral (12/17 1641) BP: 111/65 (12/17 1641) Pulse Rate: 64 (12/17 1309)  Labs: Recent Labs    01/05/19 1458 01/06/19 0319  HGB 13.4 12.3  HCT 39.9 38.2  PLT 506* 362  CREATININE 0.88 0.96  TROPONINIHS  --  5    Estimated Creatinine Clearance: 47.2 mL/min (by C-G formula based on SCr of 0.96 mg/dL).   Medical History: Past Medical History:  Diagnosis Date  . Hypertension     Medications:  Scheduled:  . enoxaparin (LOVENOX) injection  40 mg Subcutaneous Q12H  . mouth rinse  15 mL Mouth Rinse BID  . methylPREDNISolone (SOLU-MEDROL) injection  40 mg Intravenous Q12H  . pantoprazole  40 mg Oral Daily   Infusions:  . [START ON 01/07/2019] remdesivir 100 mg in NS 100 mL     PRN: acetaminophen **OR** acetaminophen, guaiFENesin-dextromethorphan, ondansetron **OR** ondansetron (ZOFRAN) IV  Assessment: 73 yo female with hx HTN presents with shortness of breath, found to be COVID+, started on dexamethasone, remdesivir and given actemra x 1 on 01/06/19.  CTA chest is positive for PE involving RML and RLL pulm arteries, Pharmacy consulted to begin IV heparin.  Received Lovenox 40mg  SQ today at 09:47 Baseline aPTT, PT/INR pending CBC wnl SCr 0.96 No anticoagulants PTA  Goal of Therapy:  Heparin level 0.3-0.7 units/ml Monitor platelets by anticoagulation protocol: Yes   Plan:   Heparin 2000 units IV bolus   Heparin IV infusion 1000 units/hr  Check heparin level 8hr after starting  Daily heparin level, CBC  Monitor for signs/symptoms of bleeding  Peggyann Juba, PharmD,  BCPS Pharmacy: [phone] 01/06/2019,7:37 PM

## 2019-01-06 NOTE — Progress Notes (Signed)
PROGRESS NOTE  Anna Page POE:[id number] DOB: 05-01-1945   PCP: Burman Freestone, MD  Patient is from: Home  DOA: 01/05/2019 LOS: 1  Brief Narrative / Interim history: 73 year old female with history of HTN and lower extremity edema on HCTZ presenting with progressive shortness of breath and flulike illness for 10 days.  Had 1-2 watery BMs daily during the same period of time.  In ED, COVID-19 positive.  Chest x-ray with bilateral infiltrates.  Desaturated to 88% on RA requiring 6 L by HFNC to maintain saturation in mid 90s.  Slightly tachypneic.  WBC 10.8.  K3.0.  AG 16.  Bicarb 22.  AST 48.  Inflammatory markers markedly elevated.  High-sensitivity troponin negative.  EKG NSR.  Lactic acid and pro Cal within normal.  Admitted for acute hypoxic respiratory failure due to COVID-19 pneumonia.  Started on Decadron, remdesivir and Actemra.  Subjective: Patient remains on 6 L by HFNC saturating in low 90s.  Reports dry cough.  Shortness of breath with cough.  Feels tired.  Still with watery bowel movements, x2 overnight.  Denies chest pain or UTI symptoms.  Objective: Vitals:   01/06/19 0030 01/06/19 0116 01/06/19 0127 01/06/19 0527  BP: (!) 109/54  115/65 120/64  Pulse: (!) 56  (!) 58 (!) 52  Resp: (!) 34  (!) 24 18  Temp:   98.3 F (36.8 C) 98.1 F (36.7 C)  TempSrc:   Oral Oral  SpO2: 93%  93% 94%  Weight:  67.4 kg    Height:  _0  (1.549 m)     No intake or output data in the 24 hours ending 01/06/19 1034 Filed Weights   01/05/19 1407 01/06/19 0116  Weight: 69.9 kg 67.4 kg    Examination:  GENERAL: No acute distress.  Appears well.  HEENT: MMM.  Vision and hearing grossly intact.  NECK: Supple.  No apparent JVD.  RESP: 93% on 6 L by HFNC.  No IWOB.  Rhonchi and rales bilaterally. CVS:  RRR. Heart sounds normal.  ABD/GI/GU: Bowel sounds present. Soft. Non tender.  MSK/EXT:  No apparent deformity or edema. Moves extremities. SKIN: no apparent skin lesion or  wound NEURO: Awake, alert and oriented appropriately.  No gross deficit.  PSYCH: Calm. Normal affect.  Procedures:  None  Assessment & Plan: Acute respiratory failure with hypoxia due to COVID-19 pneumonia: CXR with bilateral infiltrates.  Inflammatory markers markedly elevated.  Lactic acid and pro-Cal negative.  Requiring 6 L by HFNC to maintain saturation in low 90s. Recent Labs    01/05/19 1458 01/06/19 0319  DDIMER 2.71* 13.32*  FERRITIN 657* 589*  LDH 382*  --   CRP 29.4* 33.5*  -Received Actemra on 12/17. -Continue remdesivir.  12/17> -Change Decadron to Solu-Medrol -CTA chest to exclude PE given markedly elevated D-dimer and degree of hypoxia. -Start subcu Lovenox twice daily -Verbally consented for convalescent plasma -As needed inhalers, mucolytic's, antitussive and incentive spirometry. -Transfer to [facility]   Infectious diarrhea due to COVID-19 pneumonia -Management as above.  Essential hypertension: Normotensive -Continue holding HCTZ  Bradycardia: Asymptomatic.  EKG NSR.  She is not on nodal blocking agents. -Continue monitoring  Hypokalemia: Likely due to diarrhea and HCTZ. -Continue holding HCTZ  Elevated liver enzymes: Resolved. -Continue monitoring  Hyperglycemia without diagnosis of diabetes: Likely due to steroid. -Check hemoglobin A1c                 DVT prophylaxis: Subcu Lovenox twice daily Code Status: Full code-confirmed this with the patient.  Family Communication: Updated patient's daughter over the phone. Disposition Plan: Transfer to [facility] Consultants: None   Microbiology summarized: COVID-19 positive. Blood cultures pending.  Sch Meds:  Scheduled Meds: . dexamethasone (DECADRON) injection  6 mg Intravenous Q24H  . enoxaparin (LOVENOX) injection  40 mg Subcutaneous Q12H  . mouth rinse  15 mL Mouth Rinse BID   Continuous Infusions: . [START ON 01/07/2019] remdesivir 100 mg in NS 100 mL    . tocilizumab (ACTEMRA) IV 600 mg  (01/06/19 0951)   PRN Meds:.acetaminophen **OR** acetaminophen, guaiFENesin-dextromethorphan, ondansetron **OR** ondansetron (ZOFRAN) IV  Antimicrobials: Anti-infectives (From admission, onward)   Start     Dose/Rate Route Frequency Ordered Stop   01/07/19 1000  remdesivir 100 mg in sodium chloride 0.9 % 100 mL IVPB     100 mg 200 mL/hr over 30 Minutes Intravenous Daily 01/06/19 0157 01/11/19 0959   01/06/19 0200  remdesivir 200 mg in sodium chloride 0.9% 250 mL IVPB     200 mg 580 mL/hr over 30 Minutes Intravenous Once 01/06/19 0157 01/06/19 0317       I have personally reviewed the following labs and images: CBC: Recent Labs  Lab 01/05/19 1458 01/06/19 0319  WBC 10.8* 7.5  NEUTROABS 9.1*  --   HGB 13.4 12.3  HCT 39.9 38.2  MCV 86.2 90.1  PLT 506* 362   BMP &GFR Recent Labs  Lab 01/05/19 1458 01/06/19 0319  NA 135 138  K 3.0* 4.2  CL 97* 101  CO2 22 22  GLUCOSE 113* 173*  BUN 18 24*  CREATININE 0.88 0.96  CALCIUM 8.8* 8.6*   Estimated Creatinine Clearance: 47.2 mL/min (by C-G formula based on SCr of 0.96 mg/dL). Liver & Pancreas: Recent Labs  Lab 01/05/19 1458 01/06/19 0319  AST 48* 41  ALT 31 34  ALKPHOS 110 107  BILITOT 1.0 1.1  PROT 7.6 6.8  ALBUMIN 3.2* 2.7*   No results for input(s): LIPASE, AMYLASE in the last 168 hours. No results for input(s): AMMONIA in the last 168 hours. Diabetic: No results for input(s): HGBA1C in the last 72 hours. No results for input(s): GLUCAP in the last 168 hours. Cardiac Enzymes: No results for input(s): CKTOTAL, CKMB, CKMBINDEX, TROPONINI in the last 168 hours. No results for input(s): PROBNP in the last 8760 hours. Coagulation Profile: No results for input(s): INR, PROTIME in the last 168 hours. Thyroid Function Tests: No results for input(s): TSH, T4TOTAL, FREET4, T3FREE, THYROIDAB in the last 72 hours. Lipid Profile: Recent Labs    01/05/19 1458  TRIG 103   Anemia Panel: Recent Labs    01/05/19 1458  01/06/19 0319  FERRITIN 657* 589*   Urine analysis: No results found for: COLORURINE, APPEARANCEUR, LABSPEC, PHURINE, GLUCOSEU, HGBUR, BILIRUBINUR, KETONESUR, PROTEINUR, UROBILINOGEN, NITRITE, LEUKOCYTESUR Sepsis Labs: Invalid input(s): PROCALCITONIN, [place]  Microbiology: Recent Results (from the past 240 hour(s))  SARS Coronavirus 2 Ag (30 min TAT) - Nasal Swab (BD Veritor Kit)     Status: None   Collection Time: 01/05/19  2:59 PM   Specimen: Nasal Swab (BD Veritor Kit)  Result Value Ref Range Status   SARS Coronavirus 2 Ag NEGATIVE NEGATIVE Final    Comment: (NOTE) SARS-CoV-2 antigen NOT DETECTED.  Negative results are presumptive.  Negative results do not preclude SARS-CoV-2 infection and should not be used as the sole basis for treatment or other patient management decisions, including infection  control decisions, particularly in the presence of clinical signs and  symptoms consistent with COVID-19, or in those  who have been in contact with the virus.  Negative results must be combined with clinical observations, patient history, and epidemiological information. The expected result is Negative. Fact Sheet for Patients: PodPark.tn Fact Sheet for Healthcare Providers: GiftContent.is This test is not yet approved or cleared by the [place] FDA and  has been authorized for detection and/or diagnosis of SARS-CoV-2 by FDA under an Emergency Use Authorization (EUA).  This EUA will remain in effect (meaning this test can be used) for the duration of  the COVID-19 de claration under Section 564(b)(1) of the Act, 21 U.S.C. section 360bbb-3(b)(1), unless the authorization is terminated or revoked sooner. Performed at [facility], [address] [place], [place] [address]   Respiratory Panel by RT PCR (Flu A&B, Covid) - Nasopharyngeal Swab     Status: Abnormal   Collection Time: 01/05/19  4:42 PM    Specimen: Nasopharyngeal Swab  Result Value Ref Range Status   SARS Coronavirus 2 by RT PCR POSITIVE (A) NEGATIVE Final    Comment: RESULT CALLED TO, READ BACK BY AND VERIFIED WITH: K NEAL RN 01/05/19 1915 JDW (NOTE) SARS-CoV-2 target nucleic acids are DETECTED. SARS-CoV-2 RNA is generally detectable in upper respiratory specimens  during the acute phase of infection. Positive results are indicative of the presence of the identified virus, but do not rule out bacterial infection or co-infection with other pathogens not detected by the test. Clinical correlation with patient history and other diagnostic information is necessary to determine patient infection status. The expected result is Negative. Fact Sheet for Patients:  PinkCheek.be Fact Sheet for Healthcare Providers: GravelBags.it This test is not yet approved or cleared by the [place] FDA and  has been authorized for detection and/or diagnosis of SARS-CoV-2 by FDA under an Emergency Use Authorization (EUA).  This EUA will remain in effect (meaning this test can be used) for the d uration of  the COVID-19 declaration under Section 564(b)(1) of the Act, 21 U.S.C. section 360bbb-3(b)(1), unless the authorization is terminated or revoked sooner.    Influenza A by PCR NEGATIVE NEGATIVE Final   Influenza B by PCR NEGATIVE NEGATIVE Final    Comment: (NOTE) The Xpert Xpress SARS-CoV-2/FLU/RSV assay is intended as an aid in  the diagnosis of influenza from Nasopharyngeal swab specimens and  should not be used as a sole basis for treatment. Nasal washings and  aspirates are unacceptable for Xpert Xpress SARS-CoV-2/FLU/RSV  testing. Fact Sheet for Patients: PinkCheek.be Fact Sheet for Healthcare Providers: GravelBags.it This test is not yet approved or cleared by the [place] FDA and  has been authorized for  detection and/or diagnosis of SARS-CoV-2 by  FDA under an Emergency Use Authorization (EUA). This EUA will remain  in effect (meaning this test can be used) for the duration of the  Covid-19 declaration under Section 564(b)(1) of the Act, 21  U.S.C. section 360bbb-3(b)(1), unless the authorization is  terminated or revoked. Performed at [facility], [place] [address] [place], [place] [address]     Radiology Studies: DG Chest Port 1 View  Result Date: 01/05/2019 CLINICAL DATA:  Shortness of breath.  Flu like symptoms.  Hypoxia. EXAM: PORTABLE CHEST 1 VIEW COMPARISON:  None. FINDINGS: The cardiomediastinal silhouette is normal. Bilateral patchy pulmonary infiltrates, most prominent in the right base. No other acute abnormalities are identified. IMPRESSION: Bilateral pulmonary infiltrates, most prominent the right base, consistent with pneumonia. Atypical infections should be considered given the appearance. Electronically Signed   By: Shanon Brow  Jimmye Norman III M.D   On: 01/05/2019 14:43    45 minutes with more than 50% spent in reviewing records, counseling patient/family and coordinating care.  Ahriyah Vannest T. [place]  If 7PM-7AM, please contact night-coverage [email or web address] Password [email or web address] 01/06/2019, 10:34 AM

## 2019-01-06 NOTE — H&P (Signed)
History and Physical    Anna Page YEB:[id number] DOB: Jan 13, 1946 DOA: 01/05/2019  PCP: Burman Freestone, MD  Patient coming from: Home.  Chief Complaint: Shortness of breath.  HPI: Anna Page is a 73 y.o. female with history of lower extremity edema takes hydrochlorothiazide has been experiencing shortness of breath flulike symptoms over the last 10 days.  Has had diarrhea at least one episode every day.  Denies vomiting chest pain has been a nonproductive cough.  Given the persistent symptoms patient came to the ER admits that [facility]  ED Course: In the ER patient was having a fever of 99.5 and chest x-ray showing bilateral infiltrates.  Covid test came back positive.  Patient was requiring 6 L oxygen to maintain sats.  Patient was started on Decadron.  Inflammatory markers elevated with ferritin of 657 CRP 29.4 WBC count was 10.8.  EKG shows normal sinus rhythm.  Patient admitted for acute respiratory failure with hypoxia secondary to Covid infection.  Review of Systems: As per HPI, rest all negative.   Past Medical History:  Diagnosis Date  . Hypertension     History reviewed. No pertinent surgical history.   reports that she has never smoked. She has never used smokeless tobacco. She reports that she does not drink alcohol or use drugs.  Allergies  Allergen Reactions  . Prednisone Nausea Only    lightheaded    Family History  Family history unknown: Yes    Prior to Admission medications   Medication Sig Start Date End Date Taking? Authorizing Provider  Docusate Calcium (STOOL SOFTENER PO) Take by mouth.    [provider]  HYDROCHLOROTHIAZIDE PO Take by mouth.    [provider]  IRON PO Take by mouth.    [provider]    Physical Exam: Constitutional: Moderately built and nourished. Vitals:   01/06/19 0000 01/06/19 0030 01/06/19 0116 01/06/19 0127  BP: 113/65 (!) 109/54  115/65  Pulse: (!) 59 (!) 56  (!) 58  Resp:  (!) 26 (!) 34  (!) 24  Temp:    98.3 F (36.8 C)  TempSrc:    Oral  SpO2: 97% 93%  93%  Weight:   67.4 kg   Height:   5' 1"  (1.549 m)    Eyes: Anicteric no pallor. ENMT: No discharge from the ears eyes nose or mouth. Neck: No mass felt.  No neck rigidity. Respiratory: No rhonchi or crepitations. Cardiovascular: S1-S2 heard. Abdomen: Soft nontender bowel sounds present. Musculoskeletal: No edema.  No joint effusion. Skin: No rash. Neurologic: Alert awake oriented to time place and person.  Moves all extremities. Psychiatric: Appears normal per normal affect.   Labs on Admission: I have personally reviewed following labs and imaging studies  CBC: Recent Labs  Lab 01/05/19 1458  WBC 10.8*  NEUTROABS 9.1*  HGB 13.4  HCT 39.9  MCV 86.2  PLT 837*   Basic Metabolic Panel: Recent Labs  Lab 01/05/19 1458  NA 135  K 3.0*  CL 97*  CO2 22  GLUCOSE 113*  BUN 18  CREATININE 0.88  CALCIUM 8.8*   GFR: Estimated Creatinine Clearance: 51.5 mL/min (by C-G formula based on SCr of 0.88 mg/dL). Liver Function Tests: Recent Labs  Lab 01/05/19 1458  AST 48*  ALT 31  ALKPHOS 110  BILITOT 1.0  PROT 7.6  ALBUMIN 3.2*   No results for input(s): LIPASE, AMYLASE in the last 168 hours. No results for input(s): AMMONIA in the last 168 hours. Coagulation  Profile: No results for input(s): INR, PROTIME in the last 168 hours. Cardiac Enzymes: No results for input(s): CKTOTAL, CKMB, CKMBINDEX, TROPONINI in the last 168 hours. BNP (last 3 results) No results for input(s): PROBNP in the last 8760 hours. HbA1C: No results for input(s): HGBA1C in the last 72 hours. CBG: No results for input(s): GLUCAP in the last 168 hours. Lipid Profile: Recent Labs    01/05/19 1458  TRIG 103   Thyroid Function Tests: No results for input(s): TSH, T4TOTAL, FREET4, T3FREE, THYROIDAB in the last 72 hours. Anemia Panel: Recent Labs    01/05/19 1458  FERRITIN 657*   Urine analysis: No  results found for: COLORURINE, APPEARANCEUR, LABSPEC, PHURINE, GLUCOSEU, HGBUR, BILIRUBINUR, KETONESUR, PROTEINUR, UROBILINOGEN, NITRITE, LEUKOCYTESUR Sepsis Labs: @LABRCNTIP (procalcitonin:4,lacticidven:4) ) Recent Results (from the past 240 hour(s))  SARS Coronavirus 2 Ag (30 min TAT) - Nasal Swab (BD Veritor Kit)     Status: None   Collection Time: 01/05/19  2:59 PM   Specimen: Nasal Swab (BD Veritor Kit)  Result Value Ref Range Status   SARS Coronavirus 2 Ag NEGATIVE NEGATIVE Final    Comment: (NOTE) SARS-CoV-2 antigen NOT DETECTED.  Negative results are presumptive.  Negative results do not preclude SARS-CoV-2 infection and should not be used as the sole basis for treatment or other patient management decisions, including infection  control decisions, particularly in the presence of clinical signs and  symptoms consistent with COVID-19, or in those who have been in contact with the virus.  Negative results must be combined with clinical observations, patient history, and epidemiological information. The expected result is Negative. Fact Sheet for Patients: PodPark.tn Fact Sheet for Healthcare Providers: GiftContent.is This test is not yet approved or cleared by the [place] FDA and  has been authorized for detection and/or diagnosis of SARS-CoV-2 by FDA under an Emergency Use Authorization (EUA).  This EUA will remain in effect (meaning this test can be used) for the duration of  the COVID-19 de claration under Section 564(b)(1) of the Act, 21 U.S.C. section 360bbb-3(b)(1), unless the authorization is terminated or revoked sooner. Performed at [facility], [address] [place], [place] [address]   Respiratory Panel by RT PCR (Flu A&B, Covid) - Nasopharyngeal Swab     Status: Abnormal   Collection Time: 01/05/19  4:42 PM   Specimen: Nasopharyngeal Swab  Result Value Ref Range Status   SARS  Coronavirus 2 by RT PCR POSITIVE (A) NEGATIVE Final    Comment: RESULT CALLED TO, READ BACK BY AND VERIFIED WITH: K NEAL RN 01/05/19 1915 JDW (NOTE) SARS-CoV-2 target nucleic acids are DETECTED. SARS-CoV-2 RNA is generally detectable in upper respiratory specimens  during the acute phase of infection. Positive results are indicative of the presence of the identified virus, but do not rule out bacterial infection or co-infection with other pathogens not detected by the test. Clinical correlation with patient history and other diagnostic information is necessary to determine patient infection status. The expected result is Negative. Fact Sheet for Patients:  PinkCheek.be Fact Sheet for Healthcare Providers: GravelBags.it This test is not yet approved or cleared by the [place] FDA and  has been authorized for detection and/or diagnosis of SARS-CoV-2 by FDA under an Emergency Use Authorization (EUA).  This EUA will remain in effect (meaning this test can be used) for the d uration of  the COVID-19 declaration under Section 564(b)(1) of the Act, 21 U.S.C. section 360bbb-3(b)(1), unless the authorization is terminated or revoked sooner.  Influenza A by PCR NEGATIVE NEGATIVE Final   Influenza B by PCR NEGATIVE NEGATIVE Final    Comment: (NOTE) The Xpert Xpress SARS-CoV-2/FLU/RSV assay is intended as an aid in  the diagnosis of influenza from Nasopharyngeal swab specimens and  should not be used as a sole basis for treatment. Nasal washings and  aspirates are unacceptable for Xpert Xpress SARS-CoV-2/FLU/RSV  testing. Fact Sheet for Patients: PinkCheek.be Fact Sheet for Healthcare Providers: GravelBags.it This test is not yet approved or cleared by the [place] FDA and  has been authorized for detection and/or diagnosis of SARS-CoV-2 by  FDA under an Emergency  Use Authorization (EUA). This EUA will remain  in effect (meaning this test can be used) for the duration of the  Covid-19 declaration under Section 564(b)(1) of the Act, 21  U.S.C. section 360bbb-3(b)(1), unless the authorization is  terminated or revoked. Performed at [facility], [place] [address], [place], [place] [address]      Radiological Exams on Admission: DG Chest Port 1 View  Result Date: 01/05/2019 CLINICAL DATA:  Shortness of breath.  Flu like symptoms.  Hypoxia. EXAM: PORTABLE CHEST 1 VIEW COMPARISON:  None. FINDINGS: The cardiomediastinal silhouette is normal. Bilateral patchy pulmonary infiltrates, most prominent in the right base. No other acute abnormalities are identified. IMPRESSION: Bilateral pulmonary infiltrates, most prominent the right base, consistent with pneumonia. Atypical infections should be considered given the appearance. Electronically Signed   By: Dorise Bullion III M.D   On: 01/05/2019 14:43    EKG: Independently reviewed.  Normal sinus rhythm.  Assessment/Plan Principal Problem:   Acute respiratory disease due to COVID-19 virus Active Problems:   Multifocal pneumonia   Hypokalemia   Acute respiratory failure with hypoxia (HCC)    1. Acute respiratory failure with hypoxia secondary to Covid virus infection for which I have ordered IV remdesivir and IV Decadron.  If patient continues to remain hypoxic and inflammatory markers continues remain high I discussed with patient about starting Actemra which is off label use and also discussed about the adverse effects and also contraindication with patient.  Patient agrees to taking. 2. Hypokalemia likely from diuretic use replace and recheck. 3. History of lower extremity edema on hydrochlorothiazide dose not known.  Presently has no edema.  On exam.  Given the patient has acute respiratory failure with Covid infection will need more than 2 midnights and inpatient status.   DVT prophylaxis:  Lovenox. Code Status: Full code. Family Communication: Discussed with patient. Disposition Plan: Home. Consults called: None. Admission status: Inpatient.   Rise Patience MD [facility] Pager [phone].  If 7PM-7AM, please contact night-coverage [email or web address] Password [email or web address]  01/06/2019, 1:55 AM

## 2019-01-07 ENCOUNTER — Inpatient Hospital Stay (HOSPITAL_COMMUNITY): Payer: Medicare HMO

## 2019-01-07 DIAGNOSIS — I2699 Other pulmonary embolism without acute cor pulmonale: Secondary | ICD-10-CM

## 2019-01-07 DIAGNOSIS — I2602 Saddle embolus of pulmonary artery with acute cor pulmonale: Secondary | ICD-10-CM

## 2019-01-07 LAB — HEPARIN LEVEL (UNFRACTIONATED)
Heparin Unfractionated: 0.25 IU/mL — ABNORMAL LOW (ref 0.30–0.70)
Heparin Unfractionated: 0.28 IU/mL — ABNORMAL LOW (ref 0.30–0.70)
Heparin Unfractionated: 0.51 IU/mL (ref 0.30–0.70)

## 2019-01-07 LAB — COMPREHENSIVE METABOLIC PANEL
ALT: 36 U/L (ref 0–44)
AST: 39 U/L (ref 15–41)
Albumin: 2.8 g/dL — ABNORMAL LOW (ref 3.5–5.0)
Alkaline Phosphatase: 108 U/L (ref 38–126)
Anion gap: 12 (ref 5–15)
BUN: 33 mg/dL — ABNORMAL HIGH (ref 8–23)
CO2: 24 mmol/L (ref 22–32)
Calcium: 9.1 mg/dL (ref 8.9–10.3)
Chloride: 104 mmol/L (ref 98–111)
Creatinine, Ser: 0.93 mg/dL (ref 0.44–1.00)
GFR calc Af Amer: >60 mL/min (ref >60)
GFR calc non Af Amer: >60 mL/min (ref >60)
Glucose, Bld: 135 mg/dL — ABNORMAL HIGH (ref 70–99)
Potassium: 3.9 mmol/L (ref 3.5–5.1)
Sodium: 140 mmol/L (ref 135–145)
Total Bilirubin: 0.5 mg/dL (ref 0.3–1.2)
Total Protein: 7 g/dL (ref 6.5–8.1)

## 2019-01-07 LAB — CBC WITH DIFFERENTIAL/PLATELET
Abs Immature Granulocytes: 0.1 10*3/uL — ABNORMAL HIGH (ref 0.00–0.07)
Basophils Absolute: 0 10*3/uL (ref 0.0–0.1)
Basophils Relative: 0 %
Eosinophils Absolute: 0 10*3/uL (ref 0.0–0.5)
Eosinophils Relative: 0 %
HCT: 37.9 % (ref 36.0–46.0)
Hemoglobin: 12.5 g/dL (ref 12.0–15.0)
Immature Granulocytes: 1 %
Lymphocytes Relative: 11 %
Lymphs Abs: 1.3 10*3/uL (ref 0.7–4.0)
MCH: 29 pg (ref 26.0–34.0)
MCHC: 33 g/dL (ref 30.0–36.0)
MCV: 87.9 fL (ref 80.0–100.0)
Monocytes Absolute: 0.8 10*3/uL (ref 0.1–1.0)
Monocytes Relative: 7 %
Neutro Abs: 9.8 10*3/uL — ABNORMAL HIGH (ref 1.7–7.7)
Neutrophils Relative %: 81 %
Platelets: 430 10*3/uL — ABNORMAL HIGH (ref 150–400)
RBC: 4.31 MIL/uL (ref 3.87–5.11)
RDW: 12.8 % (ref 11.5–15.5)
WBC: 12 10*3/uL — ABNORMAL HIGH (ref 4.0–10.5)
nRBC: 0 % (ref 0.0–0.2)

## 2019-01-07 LAB — ECHOCARDIOGRAM COMPLETE
Height: 61 in
Weight: 2377.44 oz

## 2019-01-07 LAB — FERRITIN: Ferritin: 815 ng/mL — ABNORMAL HIGH (ref 11–307)

## 2019-01-07 LAB — D-DIMER, QUANTITATIVE: D-Dimer, Quant: 11.25 ug/mL-FEU — ABNORMAL HIGH (ref 0.00–0.50)

## 2019-01-07 LAB — ABO/RH: ABO/RH(D): A POS

## 2019-01-07 LAB — C-REACTIVE PROTEIN: CRP: 29.5 mg/dL — ABNORMAL HIGH (ref <1.0)

## 2019-01-07 LAB — LACTATE DEHYDROGENASE: LDH: 354 U/L — ABNORMAL HIGH (ref 98–192)

## 2019-01-07 MED ORDER — INSULIN ASPART 100 UNIT/ML ~~LOC~~ SOLN
0.0000 [IU] | Freq: Three times a day (TID) | SUBCUTANEOUS | Status: DC
  Administered 2019-01-09 (×3): 1 [IU] via SUBCUTANEOUS
  Administered 2019-01-10: 0 [IU] via SUBCUTANEOUS
  Administered 2019-01-10: 1 [IU] via SUBCUTANEOUS

## 2019-01-07 MED ORDER — SODIUM CHLORIDE 0.9% IV SOLUTION: Freq: Once | INTRAVENOUS | Status: DC

## 2019-01-07 NOTE — Progress Notes (Signed)
ANTICOAGULATION CONSULT NOTE - Follow Up Consult  Pharmacy Consult for heparin Indication: pulmonary embolus  Patient Measurements: Height: 5\' 1"  (154.9 cm) Weight: 148 lb 9.4 oz (67.4 kg) IBW/kg (Calculated) : 47.8 Heparin Dosing Weight: 62 kg  Labs: Recent Labs    01/05/19 1458 01/06/19 0319 01/06/19 1950 01/07/19 0130 01/07/19 0540 01/07/19 1503  HGB 13.4 12.3  --  12.5  --   --   HCT 39.9 38.2  --  37.9  --   --   PLT 506* 362  --  430*  --   --   APTT  --   --  35  --   --   --   LABPROT  --   --  15.7*  --   --   --   INR  --   --  1.3*  --   --   --   HEPARINUNFRC  --   --   --  0.25* 0.28* 0.51  CREATININE 0.88 0.96  --  0.93  --   --   TROPONINIHS  --  5  --   --   --   --      Medications:  Infusions:  . heparin 1,100 Units/hr (01/07/19 0708)  . remdesivir 100 mg in NS 100 mL 100 mg (01/07/19 0858)    Assessment: 73yoF on heparin IV for new PE. Heparin level 0.51 is therapeutic No s/s bleeding or complications reported.  Goal of Therapy:  Heparin level 0.3-0.7 units/ml Monitor platelets by anticoagulation protocol: Yes   Plan:  Continue heparin IV infusion at 1100 units/hr Heparin level 8 hours Daily heparin level and CBC  Gretta Arab PharmD, BCPS Clinical pharmacist phone 7am- 5pm: [phone] 01/07/2019 4:38 PM

## 2019-01-07 NOTE — Progress Notes (Signed)
ANTICOAGULATION CONSULT NOTE - Initial Consult  Pharmacy Consult for Heparin Indication: pulmonary embolus  Allergies  Allergen Reactions  . Prednisone Nausea Only    lightheaded    Patient Measurements: Height: 5\' 1"  (154.9 cm) Weight: 148 lb 9.4 oz (67.4 kg) IBW/kg (Calculated) : 47.8 Heparin Dosing Weight: 62 kg  Vital Signs: Temp: 97.6 F (36.4 C) (12/18 0440) Temp Source: Oral (12/18 0440) BP: 114/57 (12/18 0440) Pulse Rate: 60 (12/18 0440)  Labs: Recent Labs    01/05/19 1458 01/06/19 0319 01/06/19 1950 01/07/19 0130 01/07/19 0540  HGB 13.4 12.3  --  12.5  --   HCT 39.9 38.2  --  37.9  --   PLT 506* 362  --  430*  --   APTT  --   --  35  --   --   LABPROT  --   --  15.7*  --   --   INR  --   --  1.3*  --   --   HEPARINUNFRC  --   --   --  0.25* 0.28*  CREATININE 0.88 0.96  --  0.93  --   TROPONINIHS  --  5  --   --   --     Estimated Creatinine Clearance: 48.7 mL/min (by C-G formula based on SCr of 0.93 mg/dL).  Assessment: 73 yo female with hx HTN presents with shortness of breath, found to be COVID+, started on dexamethasone, remdesivir and given actemra x 1 on 01/06/19.  CTA chest is positive for PE involving RML and RLL pulm arteries, Pharmacy consulted to begin IV heparin.  Received Lovenox 40mg  SQ today at 09:47 Baseline aPTT, PT/INR pending CBC wnl SCr 0.96 No anticoagulants PTA  01/07/19 0700 UPDATE Heparin level: 0.28 IU/mL, just slightly below therapeutic goal range RN reports no signs/symptoms of bleeding and no issues with infusion site CBC remains WNL and D-Dimer is trending down   Goal of Therapy:  Heparin level 0.3-0.7 units/ml Monitor platelets by anticoagulation protocol: Yes   Plan:   Increase heparin  infusion rate to 1100 units/hr  Re-check heparin level in 8 hrs  Daily heparin level, CBC  Monitor for signs/symptoms of bleeding  Despina Pole, Pharm. D. Clinical Pharmacist 01/07/2019 7:10 AM

## 2019-01-07 NOTE — Progress Notes (Signed)
  Echocardiogram 2D Echocardiogram has been performed.  Bobbye Charleston 01/07/2019, 11:58 AM

## 2019-01-07 NOTE — Progress Notes (Addendum)
PROGRESS NOTE    Anna Page  YSA:[id number] DOB: [birth year] DOA: 01/05/2019 PCP: Burman Freestone, MD   Brief Narrative:  73 year old female with history of HTN and lower extremity edema on HCTZ presenting with progressive shortness of breath and flulike illness for 10 days.  Had 1-2 watery BMs daily during the same period of time.  In ED, COVID-19 positive.  Chest x-ray with bilateral infiltrates.  Desaturated to 88% on RA requiring 6 L by HFNC to maintain saturation in mid 90s.  Slightly tachypneic.  WBC 10.8.  K3.0.  AG 16.  Bicarb 22.  AST 48.  Inflammatory markers markedly elevated.  High-sensitivity troponin negative.  EKG NSR.  Lactic acid and pro Cal within normal.  Admitted for acute hypoxic respiratory failure due to COVID-19 pneumonia.  Started on Decadron, remdesivir and Actemra.  Assessment & Plan:   Principal Problem:   Acute respiratory disease due to COVID-19 virus Active Problems:   Multifocal pneumonia   Hypokalemia   Acute respiratory failure with hypoxia (HCC)   Acute pulmonary embolism (HCC)   Acute respiratory failure with hypoxia due to COVID-19 pneumonia: CXR with bilateral infiltrates.  Inflammatory markers markedly elevated.  Lactic acid and pro-Cal negative.  Requiring 6 L by HFNC to maintain saturation in low 90s. - Continues to require 6 L [place] - Received Actemra on 12/17. - Continue remdesivir.  12/17> - continue steroids - CTA with PE with right heart strain and patchy bilateral infiltrates - continue heparin gtt - discussed risks/benefits of plasma, she was agreeable to this - signed consent. -As needed inhalers, mucolytic's, antitussive and incentive spirometry.  COVID-19 Labs  Recent Labs    01/05/19 1458 01/06/19 0319 01/07/19 0130  DDIMER 2.71* 13.32* 11.25*  FERRITIN 657* 589* 815*  LDH 382*  --  354*  CRP 29.4* 33.5* 29.5*    Lab Results  Component Value Date   SARSCOV2NAA POSITIVE (Bernd Crom) 01/05/2019   Submassive PE: Follow LE  [place] and echo Echo with mildly reduced RV systolic function, normal systolic function (see report) Continue anticoagulation  Infectious diarrhea due to COVID-19 pneumonia -Management as above.  Essential hypertension: Normotensive -Continue holding HCTZ  Bradycardia: Asymptomatic.  EKG NSR.  She is not on nodal blocking agents. -Continue monitoring  Hypokalemia: Likely due to diarrhea and HCTZ. -Continue holding HCTZ  Elevated liver enzymes: Resolved. -Continue monitoring  Hyperglycemia without diagnosis of diabetes: Likely due to steroid. -Check hemoglobin A1c -> 6.4, start SSI  DVT prophylaxis: heparin Code Status: full  Family Communication: none at bedside - discussed with daughter  Disposition Plan: pending further improvement   Consultants:   none  Procedures:  LE [place] Summary: Right: There is no evidence of deep vein thrombosis in the lower extremity. No cystic structure found in the popliteal fossa. Left: There is no evidence of deep vein thrombosis in the lower extremity. No cystic structure found in the popliteal fossa.  Echo IMPRESSIONS    1. Left ventricular ejection fraction, by visual estimation, is 65 to 70%. The left ventricle has normal function. Left ventricular septal wall thickness was mildly increased. Mildly increased left ventricular posterior wall thickness. There is no left  ventricular hypertrophy.  2. Left ventricular diastolic function could not be evaluated.  3. The left ventricle has no regional wall motion abnormalities.  4. Global right ventricle has mildly reduced systolic function.The right ventricular size is normal. No increase in right ventricular wall thickness.  5. Left atrial size was normal.  6. Right atrial size was  normal.  7. The mitral valve is normal in structure. No evidence of mitral valve regurgitation. No evidence of mitral stenosis.  8. The tricuspid valve is normal in structure. Tricuspid valve regurgitation is  trivial.  9. The aortic valve is normal in structure. Aortic valve regurgitation is not visualized. No evidence of aortic valve sclerosis or stenosis. 10. The pulmonic valve was normal in structure. Pulmonic valve regurgitation is not visualized. 11. TR signal is inadequate for assessing pulmonary artery systolic pressure. 12. The inferior vena cava is normal in size with greater than 50% respiratory variability, suggesting right atrial pressure of 3 mmHg.  Antimicrobials:  Anti-infectives (From admission, onward)   Start     Dose/Rate Route Frequency Ordered Stop   01/07/19 1000  remdesivir 100 mg in sodium chloride 0.9 % 100 mL IVPB     100 mg 200 mL/hr over 30 Minutes Intravenous Daily 01/06/19 0157 01/11/19 0959   01/06/19 0200  remdesivir 200 mg in sodium chloride 0.9% 250 mL IVPB     200 mg 580 mL/hr over 30 Minutes Intravenous Once 01/06/19 0157 01/06/19 0317     Subjective: C/o SOB,  Objective: Vitals:   01/07/19 0440 01/07/19 0731 01/07/19 1104 01/07/19 1537  BP: (!) 114/57 (!) 109/54 (!) 113/58 109/61  Pulse: 60 61 69 77  Resp: 20 (!) _0 Temp: 97.6 F (36.4 C) 97.8 F (36.6 C) 98.1 F (36.7 C) 98.3 F (36.8 C)  TempSrc: Oral Oral Oral Oral  SpO2: 90% 91% 91% 91%  Weight:      Height:        Intake/Output Summary (Last 24 hours) at 01/07/2019 1633 Last data filed at 01/07/2019 1537 Gross per 24 hour  Intake 425.23 ml  Output --  Net 425.23 ml   Filed Weights   01/05/19 1407 01/06/19 0116  Weight: 69.9 kg 67.4 kg    Examination:  General exam: Appears calm and comfortable  Respiratory system: normal WOB, no adventitious lung sounds Cardiovascular system: RRR Gastrointestinal system: Abdomen is nondistended, soft and nontender.  Central nervous system: Alert and oriented. No focal neurological deficits. Extremities: no LEE Skin: No rashes, lesions or ulcers Psychiatry: Judgement and insight appear normal. Mood & affect appropriate.     Data  Reviewed: I have personally reviewed following labs and imaging studies  CBC: Recent Labs  Lab 01/05/19 1458 01/06/19 0319 01/07/19 0130  WBC 10.8* 7.5 12.0*  NEUTROABS 9.1*  --  9.8*  HGB 13.4 12.3 12.5  HCT 39.9 38.2 37.9  MCV 86.2 90.1 87.9  PLT 506* 362 629*   Basic Metabolic Panel: Recent Labs  Lab 01/05/19 1458 01/06/19 0319 01/07/19 0130  NA 135 138 140  K 3.0* 4.2 3.9  CL 97* 101 104  CO2 _1 GLUCOSE 113* 173* 135*  BUN 18 24* 33*  CREATININE 0.88 0.96 0.93  CALCIUM 8.8* 8.6* 9.1   GFR: Estimated Creatinine Clearance: 48.7 mL/min (by C-G formula based on SCr of 0.93 mg/dL). Liver Function Tests: Recent Labs  Lab 01/05/19 1458 01/06/19 0319 01/07/19 0130  AST 48* 41 39  ALT 31 34 36  ALKPHOS 110 107 108  BILITOT 1.0 1.1 0.5  PROT 7.6 6.8 7.0  ALBUMIN 3.2* 2.7* 2.8*   No results for input(s): LIPASE, AMYLASE in the last 168 hours. No results for input(s): AMMONIA in the last 168 hours. Coagulation Profile: Recent Labs  Lab 01/06/19 1950  INR 1.3*   Cardiac Enzymes: No results for input(s):  CKTOTAL, CKMB, CKMBINDEX, TROPONINI in the last 168 hours. BNP (last 3 results) No results for input(s): PROBNP in the last 8760 hours. HbA1C: Recent Labs    01/06/19 0319  HGBA1C 6.4*   CBG: No results for input(s): GLUCAP in the last 168 hours. Lipid Profile: Recent Labs    01/05/19 1458  TRIG 103   Thyroid Function Tests: No results for input(s): TSH, T4TOTAL, FREET4, T3FREE, THYROIDAB in the last 72 hours. Anemia Panel: Recent Labs    01/06/19 0319 01/07/19 0130  FERRITIN 589* 815*   Sepsis Labs: Recent Labs  Lab 01/05/19 1430 01/05/19 1458  PROCALCITON  --  <0.10  LATICACIDVEN 1.5  --     Recent Results (from the past 240 hour(s))  Blood Culture (routine x 2)     Status: None (Preliminary result)   Collection Time: 01/05/19  2:20 PM   Specimen: BLOOD  Result Value Ref Range Status   Specimen Description   Final    BLOOD  LEFT ANTECUBITAL Performed at [facility], [address] [place], [place] [address]    Special Requests   Final    BOTTLES DRAWN AEROBIC ONLY Blood Culture adequate volume Performed at [facility], [address] [place], [place] [address]    Culture   Final    NO GROWTH 2 DAYS Performed at [facility], [place] [address], [place], [place] [address]    Report Status PENDING  Incomplete  Blood Culture (routine x 2)     Status: None (Preliminary result)   Collection Time: 01/05/19  2:25 PM   Specimen: BLOOD  Result Value Ref Range Status   Specimen Description   Final    BLOOD BLOOD LEFT WRIST Performed at [facility], [address] [place], [place] [address]    Special Requests   Final    BOTTLES DRAWN AEROBIC AND ANAEROBIC Blood Culture adequate volume Performed at [facility], [address] [place], [place] [address]    Culture   Final    NO GROWTH 2 DAYS Performed at [facility], [place] [address], [place], [place] [address]    Report Status PENDING  Incomplete  SARS Coronavirus 2 Ag (30 min TAT) - Nasal Swab (BD Veritor Kit)     Status: None   Collection Time: 01/05/19  2:59 PM   Specimen: Nasal Swab (BD Veritor Kit)  Result Value Ref Range Status   SARS Coronavirus 2 Ag NEGATIVE NEGATIVE Final    Comment: (NOTE) SARS-CoV-2 antigen NOT DETECTED.  Negative results are presumptive.  Negative results do not preclude SARS-CoV-2 infection and should not be used as the sole basis for treatment or other patient management decisions, including infection  control decisions, particularly in the presence of clinical signs and  symptoms consistent with COVID-19, or in those who have been in contact with the virus.  Negative results must be combined with clinical observations, patient history, and epidemiological information. The expected result is Negative. Fact Sheet for Patients:  PodPark.tn Fact Sheet for Healthcare Providers: GiftContent.is This test is not yet approved or cleared by the [place] FDA and  has been authorized for detection and/or diagnosis of SARS-CoV-2 by FDA under an Emergency Use Authorization (EUA).  This EUA will remain in effect (meaning this test can be used) for the duration of  the COVID-19 de claration under Section 564(b)(1) of the Act, 21 U.S.C. section 360bbb-3(b)(1), unless the authorization  is terminated or revoked sooner. Performed at [facility], [address] [place], [place] [address]   Respiratory Panel by RT PCR (Flu Jennefer Kopp&B, Covid) - Nasopharyngeal Swab     Status: Abnormal   Collection Time: 01/05/19  4:42 PM   Specimen: Nasopharyngeal Swab  Result Value Ref Range Status   SARS Coronavirus 2 by RT PCR POSITIVE (Lauri Purdum) NEGATIVE Final    Comment: RESULT CALLED TO, READ BACK BY AND VERIFIED WITH: K NEAL RN 01/05/19 1915 JDW (NOTE) SARS-CoV-2 target nucleic acids are DETECTED. SARS-CoV-2 RNA is generally detectable in upper respiratory specimens  during the acute phase of infection. Positive results are indicative of the presence of the identified virus, but do not rule out bacterial infection or co-infection with other pathogens not detected by the test. Clinical correlation with patient history and other diagnostic information is necessary to determine patient infection status. The expected result is Negative. Fact Sheet for Patients:  PinkCheek.be Fact Sheet for Healthcare Providers: GravelBags.it This test is not yet approved or cleared by the [place] FDA and  has been authorized for detection and/or diagnosis of SARS-CoV-2 by FDA under an Emergency Use Authorization (EUA).  This EUA will remain in effect (meaning this test can be used) for the d uration of  the COVID-19 declaration  under Section 564(b)(1) of the Act, 21 U.S.C. section 360bbb-3(b)(1), unless the authorization is terminated or revoked sooner.    Influenza Micalah Cabezas by PCR NEGATIVE NEGATIVE Final   Influenza B by PCR NEGATIVE NEGATIVE Final    Comment: (NOTE) The Xpert Xpress SARS-CoV-2/FLU/RSV assay is intended as an aid in  the diagnosis of influenza from Nasopharyngeal swab specimens and  should not be used as Joseline Mccampbell sole basis for treatment. Nasal washings and  aspirates are unacceptable for Xpert Xpress SARS-CoV-2/FLU/RSV  testing. Fact Sheet for Patients: PinkCheek.be Fact Sheet for Healthcare Providers: GravelBags.it This test is not yet approved or cleared by the [place] FDA and  has been authorized for detection and/or diagnosis of SARS-CoV-2 by  FDA under an Emergency Use Authorization (EUA). This EUA will remain  in effect (meaning this test can be used) for the duration of the  Covid-19 declaration under Section 564(b)(1) of the Act, 21  U.S.C. section 360bbb-3(b)(1), unless the authorization is  terminated or revoked. Performed at [facility], [place] [address], [place], [place] [address]          Radiology Studies: CT ANGIO CHEST PE W OR WO CONTRAST  Result Date: 01/06/2019 CLINICAL DATA:  Shortness of breath.  Coronavirus infection. EXAM: CT ANGIOGRAPHY CHEST WITH CONTRAST TECHNIQUE: Multidetector CT imaging of the chest was performed using the standard protocol during bolus administration of intravenous contrast. Multiplanar CT image reconstructions and MIPs were obtained to evaluate the vascular anatomy. CONTRAST:  69m OMNIPAQUE IOHEXOL 350 MG/ML SOLN COMPARISON:  Radiography 01/05/2019 FINDINGS: Cardiovascular: Pulmonary arterial opacification is good. Large pulmonary emboli on the right affecting the right middle and lower lobe pulmonary arteries. No emboli seen on the left. Right ventricular to left ventricular ratio is  1. Mediastinum/Nodes: No mediastinal or hilar mass or lymphadenopathy. Lungs/Pleura: Widespread bilateral patchy pulmonary infiltrates consistent with viral pneumonia. No pleural effusion or lobar collapse. Upper Abdomen: Stones and or sludge in the gallbladder. Cyst of the left lobe of the liver. Otherwise negative. Musculoskeletal: Negative Review of the MIP images confirms the above findings. IMPRESSION: Positive for pulmonary emboli in the right middle and lower lobe pulmonary arteries. Right ventricular  to left ventricular ratio is 1. evidence of right heart strain consistent with at least submassive (intermediate risk) PE. This is only slightly above the normal limits of 0.9. The presence of right heart strain has been associated with an increased risk of morbidity and mortality. Please see epic order set. Widespread bilateral patchy pulmonary infiltrates consistent with viral pneumonia as stated clinically. Call report in progress. Electronically Signed   By: Nelson Chimes M.D.   On: 01/06/2019 19:04   DG CHEST PORT 1 VIEW  Result Date: 01/07/2019 CLINICAL DATA:  Hypoxia. EXAM: PORTABLE CHEST 1 VIEW COMPARISON:  01/06/2019 FINDINGS: Normal heart size. No pleural effusion. No interstitial edema. Bilateral peripheral and lower lung zone predominant pulmonary opacities are again noted in appears similar to previous exam. The visualized osseous structures are unremarkable. IMPRESSION: 1. No change in aeration to the lungs compared with previous exam. Electronically Signed   By: Kerby Moors M.D.   On: 01/07/2019 11:23   ECHOCARDIOGRAM COMPLETE  Result Date: 01/07/2019   ECHOCARDIOGRAM REPORT   Patient Name:   AALAYA Chausse Date of Exam: 01/07/2019 Medical Rec #:  [id number]        Height:       2.8 in Accession #:    [id number]       Weight:       170.0 lb Date of Birth:  1945-10-12        BSA:          0.19 m Patient Age:    35 years         BP:           109/54 mmHg Patient Gender: F                 HR:           61 bpm. Exam Location:  Inpatient Procedure: 2D Echo, Cardiac Doppler and Color Doppler Indications:    I26.02 Pulmonary embolus  History:        Patient has no prior history of Echocardiogram examinations.                 Covid 19 positive. Pneumonia. Hypoxia. Respiratory failure.  Sonographer:    Roseanna Rainbow RDCS Referring Phys: [phone] Tahara Ruffini CALDWELL POWELL JR  Sonographer Comments: Image acquisition challenging due to respiratory motion. IMPRESSIONS  1. Left ventricular ejection fraction, by visual estimation, is 65 to 70%. The left ventricle has normal function. Left ventricular septal wall thickness was mildly increased. Mildly increased left ventricular posterior wall thickness. There is no left ventricular hypertrophy.  2. Left ventricular diastolic function could not be evaluated.  3. The left ventricle has no regional wall motion abnormalities.  4. Global right ventricle has mildly reduced systolic function.The right ventricular size is normal. No increase in right ventricular wall thickness.  5. Left atrial size was normal.  6. Right atrial size was normal.  7. The mitral valve is normal in structure. No evidence of mitral valve regurgitation. No evidence of mitral stenosis.  8. The tricuspid valve is normal in structure. Tricuspid valve regurgitation is trivial.  9. The aortic valve is normal in structure. Aortic valve regurgitation is not visualized. No evidence of aortic valve sclerosis or stenosis. 10. The pulmonic valve was normal in structure. Pulmonic valve regurgitation is not visualized. 11. TR signal is inadequate for assessing pulmonary artery systolic pressure. 12. The inferior vena cava is normal in size with greater than 50% respiratory variability, suggesting right atrial pressure of 3  mmHg. FINDINGS  Left Ventricle: Left ventricular ejection fraction, by visual estimation, is 65 to 70%. The left ventricle has normal function. The left ventricle has no regional wall motion  abnormalities. Mildly increased left ventricular posterior wall thickness. There is no left ventricular hypertrophy. The left ventricular diastology could not be evaluated due to nondiagnostic images. Left ventricular diastolic function could not be evaluated. Not assessed. Right Ventricle: The right ventricular size is normal. No increase in right ventricular wall thickness. Global RV systolic function is has mildly reduced systolic function. Left Atrium: Left atrial size was normal in size. Right Atrium: Right atrial size was normal in size Pericardium: There is no evidence of pericardial effusion. Mitral Valve: The mitral valve is normal in structure. No evidence of mitral valve regurgitation. No evidence of mitral valve stenosis by observation. Tricuspid Valve: The tricuspid valve is normal in structure. Tricuspid valve regurgitation is trivial. Aortic Valve: The aortic valve is normal in structure. Aortic valve regurgitation is not visualized. The aortic valve is structurally normal, with no evidence of sclerosis or stenosis. Aortic valve mean gradient measures 3.0 mmHg. Pulmonic Valve: The pulmonic valve was normal in structure. Pulmonic valve regurgitation is not visualized. Pulmonic regurgitation is not visualized. Aorta: The aortic root, ascending aorta and aortic arch are all structurally normal, with no evidence of dilitation or obstruction. Venous: The inferior vena cava is normal in size with greater than 50% respiratory variability, suggesting right atrial pressure of 3 mmHg. IAS/Shunts: No atrial level shunt detected by color flow Doppler. There is no evidence of Collyn Ribas patent foramen ovale. No ventricular septal defect is seen or detected. There is no evidence of an atrial septal defect.  LEFT VENTRICLE PLAX 2D LVIDd:         3.02 cm LVIDs:         2.08 cm LV PW:         1.50 cm LV IVS:        1.11 cm LVOT diam:     1.50 cm LV SV:         22 ml LV SV Index:   39.61 LVOT Area:     1.77 cm  RIGHT VENTRICLE  RV S prime:     103.00 cm/s TAPSE (M-mode): 1.4 cm LEFT ATRIUM           Index LA diam:      2.40 cm 12.86 cm/m LA Vol (A2C): 32.2 ml 172.49 ml/m LA Vol (A4C): 35.5 ml 190.17 ml/m  AORTIC VALVE AV Mean Grad: 3.0 mmHg LVOT Vmax:    700.00 cm/s LVOT Vmean:   300.000 cm/s LVOT VTI:     2.780 m  AORTA Ao Root diam: 2.80 cm MV E velocity: 93.10 cm/s   103 cm/s MV Loranda Mastel velocity: 8000.00 cm/s 70.3 cm/s SHUNTS MV E/Lizvet Chunn ratio:  0.01         1.5       Systemic VTI:  2.78 m                                       Systemic Diam: 1.50 cm  Fransico Him MD Electronically signed by Fransico Him MD Signature Date/Time: 01/07/2019/12:47:24 PM    Final    VAS [place] LOWER EXTREMITY VENOUS (DVT)  Result Date: 01/07/2019  Lower Venous Study Indications: Pulmonary embolism.  Comparison Study: no prior Performing Technologist: Abram Sander RVS  Examination Guidelines: Mikaya Bunner complete  evaluation includes B-mode imaging, spectral Doppler, color Doppler, and power Doppler as needed of all accessible portions of each vessel. Bilateral testing is considered an integral part of Dayln Tugwell complete examination. Limited examinations for reoccurring indications may be performed as noted.  +---------+---------------+---------+-----------+----------+--------------+ RIGHT    CompressibilityPhasicitySpontaneityPropertiesThrombus Aging +---------+---------------+---------+-----------+----------+--------------+ CFV      Full           Yes      Yes                                 +---------+---------------+---------+-----------+----------+--------------+ SFJ      Full                                                        +---------+---------------+---------+-----------+----------+--------------+ FV Prox  Full                                                        +---------+---------------+---------+-----------+----------+--------------+ FV Mid   Full                                                         +---------+---------------+---------+-----------+----------+--------------+ FV DistalFull                                                        +---------+---------------+---------+-----------+----------+--------------+ PFV      Full                                                        +---------+---------------+---------+-----------+----------+--------------+ POP      Full           Yes      Yes                                 +---------+---------------+---------+-----------+----------+--------------+ PTV      Full                                                        +---------+---------------+---------+-----------+----------+--------------+ PERO     Full                                                        +---------+---------------+---------+-----------+----------+--------------+   +---------+---------------+---------+-----------+----------+--------------+ LEFT  CompressibilityPhasicitySpontaneityPropertiesThrombus Aging +---------+---------------+---------+-----------+----------+--------------+ CFV      Full           Yes      Yes                                 +---------+---------------+---------+-----------+----------+--------------+ SFJ      Full                                                        +---------+---------------+---------+-----------+----------+--------------+ FV Prox  Full                                                        +---------+---------------+---------+-----------+----------+--------------+ FV Mid   Full                                                        +---------+---------------+---------+-----------+----------+--------------+ FV DistalFull                                                        +---------+---------------+---------+-----------+----------+--------------+ PFV      Full                                                         +---------+---------------+---------+-----------+----------+--------------+ POP      Full           Yes      Yes                                 +---------+---------------+---------+-----------+----------+--------------+ PTV      Full                                                        +---------+---------------+---------+-----------+----------+--------------+ PERO     Full                                                        +---------+---------------+---------+-----------+----------+--------------+     Summary: Right: There is no evidence of deep vein thrombosis in the lower extremity. No cystic structure found in the popliteal fossa. Left: There is no evidence of deep vein thrombosis in the lower extremity. No cystic structure found in the popliteal fossa.  *See table(s) above for  measurements and observations.    Preliminary         Scheduled Meds: . sodium chloride   Intravenous Once  . mouth rinse  15 mL Mouth Rinse BID  . methylPREDNISolone (SOLU-MEDROL) injection  40 mg Intravenous Q12H  . pantoprazole  40 mg Oral Daily   Continuous Infusions: . heparin 1,100 Units/hr (01/07/19 0708)  . remdesivir 100 mg in NS 100 mL 100 mg (01/07/19 0858)     LOS: 2 days    Time spent: over 71 min    Fayrene Helper, MD [facility] Pager AMION  If 7PM-7AM, please contact night-coverage [email or web address] Password [facility] 01/07/2019, 4:33 PM

## 2019-01-07 NOTE — Progress Notes (Signed)
Lower extremity venous has been completed.   Preliminary results in CV Proc.   Abram Sander 01/07/2019 3:38 PM

## 2019-01-07 NOTE — Plan of Care (Signed)
Plan of Care reviewed. 

## 2019-01-07 NOTE — Plan of Care (Signed)
Patient remained Aox4, started on heparin gtt due to PE on CTA scan, stable vitals throughout shift, no complaints of any chest pain or sob, safety precautions maintained, continue plan of care Problem: Education: Goal: Knowledge of General Education information will improve Description: Including pain rating scale, medication(s)/side effects and non-pharmacologic comfort measures Outcome: Progressing   Problem: Health Behavior/Discharge Planning: Goal: Ability to manage health-related needs will improve Outcome: Progressing   Problem: Clinical Measurements: Goal: Ability to maintain clinical measurements within normal limits will improve Outcome: Progressing Goal: Will remain free from infection Outcome: Progressing Goal: Diagnostic test results will improve Outcome: Progressing Goal: Respiratory complications will improve Outcome: Progressing Goal: Cardiovascular complication will be avoided Outcome: Progressing   Problem: Activity: Goal: Risk for activity intolerance will decrease Outcome: Progressing   Problem: Nutrition: Goal: Adequate nutrition will be maintained Outcome: Progressing   Problem: Coping: Goal: Level of anxiety will decrease Outcome: Progressing   Problem: Elimination: Goal: Will not experience complications related to bowel motility Outcome: Progressing Goal: Will not experience complications related to urinary retention Outcome: Progressing   Problem: Pain Managment: Goal: General experience of comfort will improve Outcome: Progressing   Problem: Safety: Goal: Ability to remain free from injury will improve Outcome: Progressing   Problem: Skin Integrity: Goal: Risk for impaired skin integrity will decrease Outcome: Progressing

## 2019-01-08 LAB — COMPREHENSIVE METABOLIC PANEL
ALT: 32 U/L (ref 0–44)
AST: 29 U/L (ref 15–41)
Albumin: 2.9 g/dL — ABNORMAL LOW (ref 3.5–5.0)
Alkaline Phosphatase: 96 U/L (ref 38–126)
Anion gap: 13 (ref 5–15)
BUN: 45 mg/dL — ABNORMAL HIGH (ref 8–23)
CO2: 23 mmol/L (ref 22–32)
Calcium: 8.9 mg/dL (ref 8.9–10.3)
Chloride: 105 mmol/L (ref 98–111)
Creatinine, Ser: 0.93 mg/dL (ref 0.44–1.00)
GFR calc Af Amer: >60 mL/min (ref >60)
GFR calc non Af Amer: >60 mL/min (ref >60)
Glucose, Bld: 148 mg/dL — ABNORMAL HIGH (ref 70–99)
Potassium: 3.6 mmol/L (ref 3.5–5.1)
Sodium: 141 mmol/L (ref 135–145)
Total Bilirubin: 0.4 mg/dL (ref 0.3–1.2)
Total Protein: 6.4 g/dL — ABNORMAL LOW (ref 6.5–8.1)

## 2019-01-08 LAB — CBC WITH DIFFERENTIAL/PLATELET
Abs Immature Granulocytes: 0.09 10*3/uL — ABNORMAL HIGH (ref 0.00–0.07)
Basophils Absolute: 0 10*3/uL (ref 0.0–0.1)
Basophils Relative: 0 %
Eosinophils Absolute: 0 10*3/uL (ref 0.0–0.5)
Eosinophils Relative: 0 %
HCT: 34.5 % — ABNORMAL LOW (ref 36.0–46.0)
Hemoglobin: 11.3 g/dL — ABNORMAL LOW (ref 12.0–15.0)
Immature Granulocytes: 1 %
Lymphocytes Relative: 11 %
Lymphs Abs: 1 10*3/uL (ref 0.7–4.0)
MCH: 28.8 pg (ref 26.0–34.0)
MCHC: 32.8 g/dL (ref 30.0–36.0)
MCV: 88 fL (ref 80.0–100.0)
Monocytes Absolute: 0.5 10*3/uL (ref 0.1–1.0)
Monocytes Relative: 5 %
Neutro Abs: 8.2 10*3/uL — ABNORMAL HIGH (ref 1.7–7.7)
Neutrophils Relative %: 83 %
Platelets: 437 10*3/uL — ABNORMAL HIGH (ref 150–400)
RBC: 3.92 MIL/uL (ref 3.87–5.11)
RDW: 12.9 % (ref 11.5–15.5)
WBC: 9.8 10*3/uL (ref 4.0–10.5)
nRBC: 0 % (ref 0.0–0.2)

## 2019-01-08 LAB — GLUCOSE, CAPILLARY
Glucose-Capillary: 145 mg/dL — ABNORMAL HIGH (ref 70–99)
Glucose-Capillary: 125 mg/dL — ABNORMAL HIGH (ref 70–99)

## 2019-01-08 LAB — LACTATE DEHYDROGENASE: LDH: 315 U/L — ABNORMAL HIGH (ref 98–192)

## 2019-01-08 LAB — HEPARIN LEVEL (UNFRACTIONATED): Heparin Unfractionated: 0.52 IU/mL (ref 0.30–0.70)

## 2019-01-08 LAB — FERRITIN: Ferritin: 753 ng/mL — ABNORMAL HIGH (ref 11–307)

## 2019-01-08 LAB — D-DIMER, QUANTITATIVE: D-Dimer, Quant: 2.84 ug/mL-FEU — ABNORMAL HIGH (ref 0.00–0.50)

## 2019-01-08 LAB — C-REACTIVE PROTEIN: CRP: 13.9 mg/dL — ABNORMAL HIGH (ref <1.0)

## 2019-01-08 NOTE — Progress Notes (Addendum)
PROGRESS NOTE    Anna Page  MBT:[id number] DOB: May 24, 1945 DOA: 01/05/2019 PCP: Burman Freestone, MD   Brief Narrative:  73 year old female with history of HTN and lower extremity edema on HCTZ presenting with progressive shortness of breath and flulike illness for 10 days.  Had 1-2 watery BMs daily during the same period of time.  In ED, COVID-19 positive.  Chest x-ray with bilateral infiltrates.  Desaturated to 88% on RA requiring 6 L by HFNC to maintain saturation in mid 90s.  Slightly tachypneic.  WBC 10.8.  K3.0.  AG 16.  Bicarb 22.  AST 48.  Inflammatory markers markedly elevated.  High-sensitivity troponin negative.  EKG NSR.  Lactic acid and pro Cal within normal.  Admitted for acute hypoxic respiratory failure due to COVID-19 pneumonia.  Started on Decadron, remdesivir and Actemra.  Assessment & Plan:   Principal Problem:   Acute respiratory disease due to COVID-19 virus Active Problems:   Multifocal pneumonia   Hypokalemia   Acute respiratory failure with hypoxia (HCC)   Acute pulmonary embolism (HCC)   Acute respiratory failure with hypoxia due to COVID-19 pneumonia: CXR with bilateral infiltrates.  Inflammatory markers markedly elevated.  Lactic acid and pro-Cal negative.  Requiring 6 L by HFNC to maintain saturation in low 90s. - O2 needs have improved to 2 L by [place] today - Received Actemra on 12/17. - Continue remdesivir.  12/17 -  - s/p plasma 12/18 - continue steroids - CTA with PE with right heart strain and patchy bilateral infiltrates - continue heparin gtt -As needed inhalers, mucolytic's, antitussive and incentive spirometry.  COVID-19 Labs  Recent Labs    01/06/19 0319 01/07/19 0130 01/08/19 0453  DDIMER 13.32* 11.25* 2.84*  FERRITIN 589* 815* 753*  LDH  --  354* 315*  CRP 33.5* 29.5* 13.9*    Lab Results  Component Value Date   SARSCOV2NAA POSITIVE (Cheryln Balcom) 01/05/2019   Submassive PE: Follow LE [place] (negative for DVT - prelim) and echo  (mildly reduced systolic function) Echo with mildly reduced RV systolic function, normal systolic function (see report) Will need f/u echo outpatient  Continue anticoagulation - needs 3-6 months and then discussion of long term anticoagulation - provoked (by COVID) first time event, she does note fam hx of PE.   Infectious diarrhea due to COVID-19 pneumonia -Management as above.  Essential hypertension: Normotensive -Continue holding HCTZ  Bradycardia: Asymptomatic.  EKG NSR.  She is not on nodal blocking agents. -Continue monitoring  Hypokalemia: Likely due to diarrhea and HCTZ. -Continue holding HCTZ  Elevated liver enzymes: Resolved. -Continue monitoring  Hyperglycemia without diagnosis of diabetes: Likely due to steroid. -Check hemoglobin A1c -> 6.4, start SSI  DVT prophylaxis: heparin Code Status: full  Family Communication: none at bedside - discussed with daughter 12/18, no answer 12/19 Disposition Plan: pending further improvement   Consultants:   none  Procedures:  LE [place] Summary: Right: There is no evidence of deep vein thrombosis in the lower extremity. No cystic structure found in the popliteal fossa. Left: There is no evidence of deep vein thrombosis in the lower extremity. No cystic structure found in the popliteal fossa.  Echo IMPRESSIONS    1. Left ventricular ejection fraction, by visual estimation, is 65 to 70%. The left ventricle has normal function. Left ventricular septal wall thickness was mildly increased. Mildly increased left ventricular posterior wall thickness. There is no left  ventricular hypertrophy.  2. Left ventricular diastolic function could not be evaluated.  3. The left ventricle  has no regional wall motion abnormalities.  4. Global right ventricle has mildly reduced systolic function.The right ventricular size is normal. No increase in right ventricular wall thickness.  5. Left atrial size was normal.  6. Right atrial size  was normal.  7. The mitral valve is normal in structure. No evidence of mitral valve regurgitation. No evidence of mitral stenosis.  8. The tricuspid valve is normal in structure. Tricuspid valve regurgitation is trivial.  9. The aortic valve is normal in structure. Aortic valve regurgitation is not visualized. No evidence of aortic valve sclerosis or stenosis. 10. The pulmonic valve was normal in structure. Pulmonic valve regurgitation is not visualized. 11. TR signal is inadequate for assessing pulmonary artery systolic pressure. 12. The inferior vena cava is normal in size with greater than 50% respiratory variability, suggesting right atrial pressure of 3 mmHg.  Antimicrobials:  Anti-infectives (From admission, onward)   Start     Dose/Rate Route Frequency Ordered Stop   01/07/19 1000  remdesivir 100 mg in sodium chloride 0.9 % 100 mL IVPB     100 mg 200 mL/hr over 30 Minutes Intravenous Daily 01/06/19 0157 01/11/19 0959   01/06/19 0200  remdesivir 200 mg in sodium chloride 0.9% 250 mL IVPB     200 mg 580 mL/hr over 30 Minutes Intravenous Once 01/06/19 0157 01/06/19 0317     Subjective: Feeling better today  Objective: Vitals:   01/08/19 1000 01/08/19 1003 01/08/19 1016 01/08/19 1228  BP:   103/60 115/62  Pulse: 62 62 62 (!) 59  Resp: '18 14 15   '$ Temp:    98.1 F (36.7 C)  TempSrc:   Oral Oral  SpO2: 100% 99% 97% 97%  Weight:      Height:        Intake/Output Summary (Last 24 hours) at 01/08/2019 1511 Last data filed at 01/08/2019 0200 Gross per 24 hour  Intake 318.83 ml  Output --  Net 318.83 ml   Filed Weights   01/05/19 1407 01/06/19 0116 01/08/19 0424  Weight: 69.9 kg 67.4 kg 70.1 kg    Examination:  General: No acute distress. Cardiovascular: RRR Lungs: Clear to auscultation bilaterally, unlabored Abdomen: Soft, nontender, nondistended Neurological: Alert and oriented 3. Moves all extremities 4. Cranial nerves II through XII grossly intact. Skin: Warm  and dry. No rashes or lesions. Extremities: No clubbing or cyanosis. No edema.   Data Reviewed: I have personally reviewed following labs and imaging studies  CBC: Recent Labs  Lab 01/05/19 1458 01/06/19 0319 01/07/19 0130 01/08/19 0453  WBC 10.8* 7.5 12.0* 9.8  NEUTROABS 9.1*  --  9.8* 8.2*  HGB 13.4 12.3 12.5 11.3*  HCT 39.9 38.2 37.9 34.5*  MCV 86.2 90.1 87.9 88.0  PLT 506* 362 430* 063*   Basic Metabolic Panel: Recent Labs  Lab 01/05/19 1458 01/06/19 0319 01/07/19 0130 01/08/19 0453  NA 135 138 140 141  K 3.0* 4.2 3.9 3.6  CL 97* 101 104 105  CO2 '22 22 24 23  '$ GLUCOSE 113* 173* 135* 148*  BUN 18 24* 33* 45*  CREATININE 0.88 0.96 0.93 0.93  CALCIUM 8.8* 8.6* 9.1 8.9   GFR: Estimated Creatinine Clearance: 49.7 mL/min (by C-G formula based on SCr of 0.93 mg/dL). Liver Function Tests: Recent Labs  Lab 01/05/19 1458 01/06/19 0319 01/07/19 0130 01/08/19 0453  AST 48* 41 39 29  ALT 31 34 36 32  ALKPHOS 110 107 108 96  BILITOT 1.0 1.1 0.5 0.4  PROT 7.6 6.8 7.0  6.4*  ALBUMIN 3.2* 2.7* 2.8* 2.9*   No results for input(s): LIPASE, AMYLASE in the last 168 hours. No results for input(s): AMMONIA in the last 168 hours. Coagulation Profile: Recent Labs  Lab 01/06/19 1950  INR 1.3*   Cardiac Enzymes: No results for input(s): CKTOTAL, CKMB, CKMBINDEX, TROPONINI in the last 168 hours. BNP (last 3 results) No results for input(s): PROBNP in the last 8760 hours. HbA1C: Recent Labs    01/06/19 0319  HGBA1C 6.4*   CBG: Recent Labs  Lab 01/08/19 1226  GLUCAP 145*   Lipid Profile: No results for input(s): CHOL, HDL, LDLCALC, TRIG, CHOLHDL, LDLDIRECT in the last 72 hours. Thyroid Function Tests: No results for input(s): TSH, T4TOTAL, FREET4, T3FREE, THYROIDAB in the last 72 hours. Anemia Panel: Recent Labs    01/07/19 0130 01/08/19 0453  FERRITIN 815* 753*   Sepsis Labs: Recent Labs  Lab 01/05/19 1430 01/05/19 1458  PROCALCITON  --  <0.10    LATICACIDVEN 1.5  --     Recent Results (from the past 240 hour(s))  Blood Culture (routine x 2)     Status: None (Preliminary result)   Collection Time: 01/05/19  2:20 PM   Specimen: BLOOD  Result Value Ref Range Status   Specimen Description   Final    BLOOD LEFT ANTECUBITAL Performed at [facility], [address] [place], [place] [address]    Special Requests   Final    BOTTLES DRAWN AEROBIC ONLY Blood Culture adequate volume Performed at [facility], [address] [place], [place] [address]    Culture   Final    NO GROWTH 3 DAYS Performed at [facility], [place] [address] [place], [place] [address]    Report Status PENDING  Incomplete  Blood Culture (routine x 2)     Status: None (Preliminary result)   Collection Time: 01/05/19  2:25 PM   Specimen: BLOOD  Result Value Ref Range Status   Specimen Description   Final    BLOOD BLOOD LEFT WRIST Performed at [facility], [address] [place], [place] [address]    Special Requests   Final    BOTTLES DRAWN AEROBIC AND ANAEROBIC Blood Culture adequate volume Performed at [facility], [address] [place], [place] [address]    Culture   Final    NO GROWTH 3 DAYS Performed at [facility], [place] [address], [place], [place] [address]    Report Status PENDING  Incomplete  SARS Coronavirus 2 Ag (30 min TAT) - Nasal Swab (BD Veritor Kit)     Status: None   Collection Time: 01/05/19  2:59 PM   Specimen: Nasal Swab (BD Veritor Kit)  Result Value Ref Range Status   SARS Coronavirus 2 Ag NEGATIVE NEGATIVE Final    Comment: (NOTE) SARS-CoV-2 antigen NOT DETECTED.  Negative results are presumptive.  Negative results do not preclude SARS-CoV-2 infection and should not be used as the sole basis for treatment or other patient management decisions, including infection  control decisions, particularly in the presence of clinical signs and  symptoms consistent with  COVID-19, or in those who have been in contact with the virus.  Negative results must be combined with clinical observations, patient history, and epidemiological information. The expected result is Negative. Fact Sheet for Patients: PodPark.tn Fact Sheet for Healthcare Providers: GiftContent.is This test is not yet approved or cleared by the [place] FDA  and  has been authorized for detection and/or diagnosis of SARS-CoV-2 by FDA under an Emergency Use Authorization (EUA).  This EUA will remain in effect (meaning this test can be used) for the duration of  the COVID-19 de claration under Section 564(b)(1) of the Act, 21 U.S.C. section 360bbb-3(b)(1), unless the authorization is terminated or revoked sooner. Performed at [facility], [address] [place], [place] [address]   Respiratory Panel by RT PCR (Flu Nicolus Ose&B, Covid) - Nasopharyngeal Swab     Status: Abnormal   Collection Time: 01/05/19  4:42 PM   Specimen: Nasopharyngeal Swab  Result Value Ref Range Status   SARS Coronavirus 2 by RT PCR POSITIVE (Elyjah Hazan) NEGATIVE Final    Comment: RESULT CALLED TO, READ BACK BY AND VERIFIED WITH: K NEAL RN 01/05/19 1915 JDW (NOTE) SARS-CoV-2 target nucleic acids are DETECTED. SARS-CoV-2 RNA is generally detectable in upper respiratory specimens  during the acute phase of infection. Positive results are indicative of the presence of the identified virus, but do not rule out bacterial infection or co-infection with other pathogens not detected by the test. Clinical correlation with patient history and other diagnostic information is necessary to determine patient infection status. The expected result is Negative. Fact Sheet for Patients:  PinkCheek.be Fact Sheet for Healthcare Providers: GravelBags.it This test is not yet approved or cleared by the [place] FDA  and  has been authorized for detection and/or diagnosis of SARS-CoV-2 by FDA under an Emergency Use Authorization (EUA).  This EUA will remain in effect (meaning this test can be used) for the d uration of  the COVID-19 declaration under Section 564(b)(1) of the Act, 21 U.S.C. section 360bbb-3(b)(1), unless the authorization is terminated or revoked sooner.    Influenza Lelon Ikard by PCR NEGATIVE NEGATIVE Final   Influenza B by PCR NEGATIVE NEGATIVE Final    Comment: (NOTE) The Xpert Xpress SARS-CoV-2/FLU/RSV assay is intended as an aid in  the diagnosis of influenza from Nasopharyngeal swab specimens and  should not be used as Starsha Morning sole basis for treatment. Nasal washings and  aspirates are unacceptable for Xpert Xpress SARS-CoV-2/FLU/RSV  testing. Fact Sheet for Patients: PinkCheek.be Fact Sheet for Healthcare Providers: GravelBags.it This test is not yet approved or cleared by the [place] FDA and  has been authorized for detection and/or diagnosis of SARS-CoV-2 by  FDA under an Emergency Use Authorization (EUA). This EUA will remain  in effect (meaning this test can be used) for the duration of the  Covid-19 declaration under Section 564(b)(1) of the Act, 21  U.S.C. section 360bbb-3(b)(1), unless the authorization is  terminated or revoked. Performed at [facility], [place] [address] [place], [place] [address]          Radiology Studies: CT ANGIO CHEST PE W OR WO CONTRAST  Result Date: 01/06/2019 CLINICAL DATA:  Shortness of breath.  Coronavirus infection. EXAM: CT ANGIOGRAPHY CHEST WITH CONTRAST TECHNIQUE: Multidetector CT imaging of the chest was performed using the standard protocol during bolus administration of intravenous contrast. Multiplanar CT image reconstructions and MIPs were obtained to evaluate the vascular anatomy. CONTRAST:  54m OMNIPAQUE IOHEXOL 350 MG/ML SOLN COMPARISON:  Radiography 01/05/2019  FINDINGS: Cardiovascular: Pulmonary arterial opacification is good. Large pulmonary emboli on the right affecting the right middle and lower lobe pulmonary arteries. No emboli seen on the left. Right ventricular to left ventricular ratio is 1. Mediastinum/Nodes: No mediastinal or hilar mass or lymphadenopathy. Lungs/Pleura: Widespread bilateral patchy pulmonary infiltrates consistent with  viral pneumonia. No pleural effusion or lobar collapse. Upper Abdomen: Stones and or sludge in the gallbladder. Cyst of the left lobe of the liver. Otherwise negative. Musculoskeletal: Negative Review of the MIP images confirms the above findings. IMPRESSION: Positive for pulmonary emboli in the right middle and lower lobe pulmonary arteries. Right ventricular to left ventricular ratio is 1. evidence of right heart strain consistent with at least submassive (intermediate risk) PE. This is only slightly above the normal limits of 0.9. The presence of right heart strain has been associated with an increased risk of morbidity and mortality. Please see epic order set. Widespread bilateral patchy pulmonary infiltrates consistent with viral pneumonia as stated clinically. Call report in progress. Electronically Signed   By: Nelson Chimes M.D.   On: 01/06/2019 19:04   DG CHEST PORT 1 VIEW  Result Date: 01/07/2019 CLINICAL DATA:  Hypoxia. EXAM: PORTABLE CHEST 1 VIEW COMPARISON:  01/06/2019 FINDINGS: Normal heart size. No pleural effusion. No interstitial edema. Bilateral peripheral and lower lung zone predominant pulmonary opacities are again noted in appears similar to previous exam. The visualized osseous structures are unremarkable. IMPRESSION: 1. No change in aeration to the lungs compared with previous exam. Electronically Signed   By: Kerby Moors M.D.   On: 01/07/2019 11:23   ECHOCARDIOGRAM COMPLETE  Result Date: 01/07/2019   ECHOCARDIOGRAM REPORT   Patient Name:   ZITLALI Kama Date of Exam: 01/07/2019 Medical Rec #:   [id number]        Height:       2.8 in Accession #:    [id number]       Weight:       170.0 lb Date of Birth:  1945-03-05        BSA:          0.19 m Patient Age:    72 years         BP:           109/54 mmHg Patient Gender: F                HR:           61 bpm. Exam Location:  Inpatient Procedure: 2D Echo, Cardiac Doppler and Color Doppler Indications:    I26.02 Pulmonary embolus  History:        Patient has no prior history of Echocardiogram examinations.                 Covid 19 positive. Pneumonia. Hypoxia. Respiratory failure.  Sonographer:    Roseanna Rainbow RDCS Referring Phys: [phone] Matsue Strom CALDWELL POWELL JR  Sonographer Comments: Image acquisition challenging due to respiratory motion. IMPRESSIONS  1. Left ventricular ejection fraction, by visual estimation, is 65 to 70%. The left ventricle has normal function. Left ventricular septal wall thickness was mildly increased. Mildly increased left ventricular posterior wall thickness. There is no left ventricular hypertrophy.  2. Left ventricular diastolic function could not be evaluated.  3. The left ventricle has no regional wall motion abnormalities.  4. Global right ventricle has mildly reduced systolic function.The right ventricular size is normal. No increase in right ventricular wall thickness.  5. Left atrial size was normal.  6. Right atrial size was normal.  7. The mitral valve is normal in structure. No evidence of mitral valve regurgitation. No evidence of mitral stenosis.  8. The tricuspid valve is normal in structure. Tricuspid valve regurgitation is trivial.  9. The aortic valve is normal in structure. Aortic valve regurgitation is not visualized.  No evidence of aortic valve sclerosis or stenosis. 10. The pulmonic valve was normal in structure. Pulmonic valve regurgitation is not visualized. 11. TR signal is inadequate for assessing pulmonary artery systolic pressure. 12. The inferior vena cava is normal in size with greater than 50% respiratory  variability, suggesting right atrial pressure of 3 mmHg. FINDINGS  Left Ventricle: Left ventricular ejection fraction, by visual estimation, is 65 to 70%. The left ventricle has normal function. The left ventricle has no regional wall motion abnormalities. Mildly increased left ventricular posterior wall thickness. There is no left ventricular hypertrophy. The left ventricular diastology could not be evaluated due to nondiagnostic images. Left ventricular diastolic function could not be evaluated. Not assessed. Right Ventricle: The right ventricular size is normal. No increase in right ventricular wall thickness. Global RV systolic function is has mildly reduced systolic function. Left Atrium: Left atrial size was normal in size. Right Atrium: Right atrial size was normal in size Pericardium: There is no evidence of pericardial effusion. Mitral Valve: The mitral valve is normal in structure. No evidence of mitral valve regurgitation. No evidence of mitral valve stenosis by observation. Tricuspid Valve: The tricuspid valve is normal in structure. Tricuspid valve regurgitation is trivial. Aortic Valve: The aortic valve is normal in structure. Aortic valve regurgitation is not visualized. The aortic valve is structurally normal, with no evidence of sclerosis or stenosis. Aortic valve mean gradient measures 3.0 mmHg. Pulmonic Valve: The pulmonic valve was normal in structure. Pulmonic valve regurgitation is not visualized. Pulmonic regurgitation is not visualized. Aorta: The aortic root, ascending aorta and aortic arch are all structurally normal, with no evidence of dilitation or obstruction. Venous: The inferior vena cava is normal in size with greater than 50% respiratory variability, suggesting right atrial pressure of 3 mmHg. IAS/Shunts: No atrial level shunt detected by color flow Doppler. There is no evidence of Youcef Klas patent foramen ovale. No ventricular septal defect is seen or detected. There is no evidence of an  atrial septal defect.  LEFT VENTRICLE PLAX 2D LVIDd:         3.02 cm LVIDs:         2.08 cm LV PW:         1.50 cm LV IVS:        1.11 cm LVOT diam:     1.50 cm LV SV:         22 ml LV SV Index:   39.61 LVOT Area:     1.77 cm  RIGHT VENTRICLE RV S prime:     103.00 cm/s TAPSE (M-mode): 1.4 cm LEFT ATRIUM           Index LA diam:      2.40 cm 12.86 cm/m LA Vol (A2C): 32.2 ml 172.49 ml/m LA Vol (A4C): 35.5 ml 190.17 ml/m  AORTIC VALVE AV Mean Grad: 3.0 mmHg LVOT Vmax:    700.00 cm/s LVOT Vmean:   300.000 cm/s LVOT VTI:     2.780 m  AORTA Ao Root diam: 2.80 cm MV E velocity: 93.10 cm/s   103 cm/s MV Sears Oran velocity: 8000.00 cm/s 70.3 cm/s SHUNTS MV E/Jametta Moorehead ratio:  0.01         1.5       Systemic VTI:  2.78 m                                       Systemic Diam: 1.50  cm  Fransico Him MD Electronically signed by Fransico Him MD Signature Date/Time: 01/07/2019/12:47:24 PM    Final    VAS [place] LOWER EXTREMITY VENOUS (DVT)  Result Date: 01/07/2019  Lower Venous Study Indications: Pulmonary embolism.  Comparison Study: no prior Performing Technologist: Abram Sander RVS  Examination Guidelines: Mally Gavina complete evaluation includes B-mode imaging, spectral Doppler, color Doppler, and power Doppler as needed of all accessible portions of each vessel. Bilateral testing is considered an integral part of Tallis Soledad complete examination. Limited examinations for reoccurring indications may be performed as noted.  +---------+---------------+---------+-----------+----------+--------------+ RIGHT    CompressibilityPhasicitySpontaneityPropertiesThrombus Aging +---------+---------------+---------+-----------+----------+--------------+ CFV      Full           Yes      Yes                                 +---------+---------------+---------+-----------+----------+--------------+ SFJ      Full                                                        +---------+---------------+---------+-----------+----------+--------------+ FV Prox   Full                                                        +---------+---------------+---------+-----------+----------+--------------+ FV Mid   Full                                                        +---------+---------------+---------+-----------+----------+--------------+ FV DistalFull                                                        +---------+---------------+---------+-----------+----------+--------------+ PFV      Full                                                        +---------+---------------+---------+-----------+----------+--------------+ POP      Full           Yes      Yes                                 +---------+---------------+---------+-----------+----------+--------------+ PTV      Full                                                        +---------+---------------+---------+-----------+----------+--------------+ PERO     Full                                                        +---------+---------------+---------+-----------+----------+--------------+   +---------+---------------+---------+-----------+----------+--------------+  LEFT     CompressibilityPhasicitySpontaneityPropertiesThrombus Aging +---------+---------------+---------+-----------+----------+--------------+ CFV      Full           Yes      Yes                                 +---------+---------------+---------+-----------+----------+--------------+ SFJ      Full                                                        +---------+---------------+---------+-----------+----------+--------------+ FV Prox  Full                                                        +---------+---------------+---------+-----------+----------+--------------+ FV Mid   Full                                                        +---------+---------------+---------+-----------+----------+--------------+ FV DistalFull                                                         +---------+---------------+---------+-----------+----------+--------------+ PFV      Full                                                        +---------+---------------+---------+-----------+----------+--------------+ POP      Full           Yes      Yes                                 +---------+---------------+---------+-----------+----------+--------------+ PTV      Full                                                        +---------+---------------+---------+-----------+----------+--------------+ PERO     Full                                                        +---------+---------------+---------+-----------+----------+--------------+     Summary: Right: There is no evidence of deep vein thrombosis in the lower extremity. No cystic structure found in the popliteal fossa. Left: There is no evidence of deep vein thrombosis in the lower extremity. No cystic structure found in the popliteal fossa.  *  See table(s) above for measurements and observations.    Preliminary         Scheduled Meds: . sodium chloride   Intravenous Once  . insulin aspart  0-9 Units Subcutaneous TID WC  . mouth rinse  15 mL Mouth Rinse BID  . methylPREDNISolone (SOLU-MEDROL) injection  40 mg Intravenous Q12H  . pantoprazole  40 mg Oral Daily   Continuous Infusions: . heparin 1,100 Units/hr (01/08/19 1021)  . remdesivir 100 mg in NS 100 mL 100 mg (01/08/19 1021)     LOS: 3 days    Time spent: over 24 min    Fayrene Helper, MD [facility] Pager AMION  If 7PM-7AM, please contact night-coverage [email or web address] Password [email or web address] 01/08/2019, 3:11 PM

## 2019-01-08 NOTE — Plan of Care (Signed)
Plan of Care reviewed. 

## 2019-01-08 NOTE — Progress Notes (Signed)
   01/08/19 1600  Family/Significant Other Communication  Family/Significant Other Update Called;Updated (Daughter, Kieth Brightly, updated)

## 2019-01-08 NOTE — Progress Notes (Addendum)
ANTICOAGULATION CONSULT NOTE - Follow Up Consult  Pharmacy Consult for heparin Indication: pulmonary embolus  Patient Measurements: Height: 5\' 1"  (154.9 cm) Weight: 148 lb 9.4 oz (67.4 kg) IBW/kg (Calculated) : 47.8 Heparin Dosing Weight: 62 kg  Labs: Recent Labs    01/05/19 1458 01/05/19 1458 01/06/19 0319 01/06/19 1950 01/07/19 0130 01/07/19 0540 01/07/19 1503 01/08/19 0012  HGB 13.4  --  12.3  --  12.5  --   --   --   HCT 39.9  --  38.2  --  37.9  --   --   --   PLT 506*  --  362  --  430*  --   --   --   APTT  --   --   --  35  --   --   --   --   LABPROT  --   --   --  15.7*  --   --   --   --   INR  --   --   --  1.3*  --   --   --   --   HEPARINUNFRC  --    < >  --   --  0.25* 0.28* 0.51 0.52  CREATININE 0.88  --  0.96  --  0.93  --   --   --   TROPONINIHS  --   --  5  --   --   --   --   --    < > = values in this interval not displayed.     Medications:  Infusions:  . heparin 1,100 Units/hr (01/08/19 0200)  . remdesivir 100 mg in NS 100 mL Stopped (01/07/19 0935)    Assessment: 73yoF on heparin IV for new PE. Heparin level 0.51 is therapeutic No s/s bleeding or complications reported.  01/08/19 0300 UPDATE Heparin level, confirmatory: 0.52 IU/mL, within desired therapeutic goal range  Per RN, no signs or symptoms of bleeding and no issues with infusion site.   Goal of Therapy:  Heparin level 0.3-0.7 units/ml Monitor platelets by anticoagulation protocol: Yes   Plan:  Continue heparin IV infusion at 1100 units/hr  Daily heparin level starting 12/20 am Daily CBC Monitor for signs/symptoms of bleeding.  Despina Pole, Pharm. D. Clinical Pharmacist 01/08/2019 3:18 AM

## 2019-01-09 LAB — COMPREHENSIVE METABOLIC PANEL
ALT: 32 U/L (ref 0–44)
AST: 27 U/L (ref 15–41)
Albumin: 2.8 g/dL — ABNORMAL LOW (ref 3.5–5.0)
Alkaline Phosphatase: 93 U/L (ref 38–126)
Anion gap: 11 (ref 5–15)
BUN: 40 mg/dL — ABNORMAL HIGH (ref 8–23)
CO2: 24 mmol/L (ref 22–32)
Calcium: 8.9 mg/dL (ref 8.9–10.3)
Chloride: 107 mmol/L (ref 98–111)
Creatinine, Ser: 0.92 mg/dL (ref 0.44–1.00)
GFR calc Af Amer: >60 mL/min (ref >60)
GFR calc non Af Amer: >60 mL/min (ref >60)
Glucose, Bld: 142 mg/dL — ABNORMAL HIGH (ref 70–99)
Potassium: 3.9 mmol/L (ref 3.5–5.1)
Sodium: 142 mmol/L (ref 135–145)
Total Bilirubin: 0.4 mg/dL (ref 0.3–1.2)
Total Protein: 6.3 g/dL — ABNORMAL LOW (ref 6.5–8.1)

## 2019-01-09 LAB — BPAM FFP
Blood Product Expiration Date: 202012191820
ISSUE DATE / TIME: 202012181902
Unit Type and Rh: 6200

## 2019-01-09 LAB — CBC WITH DIFFERENTIAL/PLATELET
Abs Immature Granulocytes: 0.14 10*3/uL — ABNORMAL HIGH (ref 0.00–0.07)
Basophils Absolute: 0 10*3/uL (ref 0.0–0.1)
Basophils Relative: 0 %
Eosinophils Absolute: 0 10*3/uL (ref 0.0–0.5)
Eosinophils Relative: 0 %
HCT: 36.1 % (ref 36.0–46.0)
Hemoglobin: 11.7 g/dL — ABNORMAL LOW (ref 12.0–15.0)
Immature Granulocytes: 2 %
Lymphocytes Relative: 13 %
Lymphs Abs: 1.2 10*3/uL (ref 0.7–4.0)
MCH: 28.9 pg (ref 26.0–34.0)
MCHC: 32.4 g/dL (ref 30.0–36.0)
MCV: 89.1 fL (ref 80.0–100.0)
Monocytes Absolute: 0.7 10*3/uL (ref 0.1–1.0)
Monocytes Relative: 7 %
Neutro Abs: 7.1 10*3/uL (ref 1.7–7.7)
Neutrophils Relative %: 78 %
Platelets: 447 10*3/uL — ABNORMAL HIGH (ref 150–400)
RBC: 4.05 MIL/uL (ref 3.87–5.11)
RDW: 12.7 % (ref 11.5–15.5)
WBC: 9.2 10*3/uL (ref 4.0–10.5)
nRBC: 0 % (ref 0.0–0.2)

## 2019-01-09 LAB — PREPARE FRESH FROZEN PLASMA: Unit division: 0

## 2019-01-09 LAB — GLUCOSE, CAPILLARY
Glucose-Capillary: 123 mg/dL — ABNORMAL HIGH (ref 70–99)
Glucose-Capillary: 149 mg/dL — ABNORMAL HIGH (ref 70–99)
Glucose-Capillary: 140 mg/dL — ABNORMAL HIGH (ref 70–99)

## 2019-01-09 LAB — C-REACTIVE PROTEIN: CRP: 8.5 mg/dL — ABNORMAL HIGH (ref <1.0)

## 2019-01-09 LAB — D-DIMER, QUANTITATIVE: D-Dimer, Quant: 3.32 ug/mL-FEU — ABNORMAL HIGH (ref 0.00–0.50)

## 2019-01-09 LAB — FERRITIN: Ferritin: 907 ng/mL — ABNORMAL HIGH (ref 11–307)

## 2019-01-09 LAB — HEPARIN LEVEL (UNFRACTIONATED)
Heparin Unfractionated: 1.3 IU/mL — ABNORMAL HIGH (ref 0.30–0.70)
Heparin Unfractionated: 1.4 IU/mL — ABNORMAL HIGH (ref 0.30–0.70)

## 2019-01-09 LAB — LACTATE DEHYDROGENASE: LDH: 356 U/L — ABNORMAL HIGH (ref 98–192)

## 2019-01-09 MED ORDER — HYDROCHLOROTHIAZIDE 25 MG PO TABS
25.0000 mg | ORAL_TABLET | Freq: Every day | ORAL | Status: DC
  Administered 2019-01-09 – 2019-01-10 (×2): 25 mg via ORAL
  Filled 2019-01-09: qty 1

## 2019-01-09 MED ORDER — DEXAMETHASONE 6 MG PO TABS
6.0000 mg | ORAL_TABLET | Freq: Every day | ORAL | Status: DC
  Administered 2019-01-10: 6 mg via ORAL
  Filled 2019-01-09: qty 1

## 2019-01-09 MED ORDER — APIXABAN 5 MG PO TABS
10.0000 mg | ORAL_TABLET | Freq: Two times a day (BID) | ORAL | Status: DC
  Administered 2019-01-09 – 2019-01-10 (×3): 10 mg via ORAL
  Filled 2019-01-09 (×2): qty 2

## 2019-01-09 MED ORDER — APIXABAN 5 MG PO TABS: 5.0000 mg | ORAL_TABLET | Freq: Two times a day (BID) | ORAL | Status: DC

## 2019-01-09 NOTE — Progress Notes (Addendum)
PROGRESS NOTE    Anna Page  YQM:[id number] DOB: 08-Feb-1945 DOA: 01/05/2019 PCP: Burman Freestone, MD   Brief Narrative:  73 year old female with history of HTN and lower extremity edema on HCTZ presenting with progressive shortness of breath and flulike illness for 10 days.  Had 1-2 watery BMs daily during the same period of time.  In ED, COVID-19 positive.  Chest x-ray with bilateral infiltrates.  Desaturated to 88% on RA requiring 6 L by HFNC to maintain saturation in mid 90s.  Slightly tachypneic.  WBC 10.8.  K3.0.  AG 16.  Bicarb 22.  AST 48.  Inflammatory markers markedly elevated.  High-sensitivity troponin negative.  EKG NSR.  Lactic acid and pro Cal within normal.  Admitted for acute hypoxic respiratory failure due to COVID-19 pneumonia.  Started on Decadron, remdesivir and Actemra.  Assessment & Plan:   Principal Problem:   Acute respiratory disease due to COVID-19 virus Active Problems:   Multifocal pneumonia   Hypokalemia   Acute respiratory failure with hypoxia (HCC)   Acute pulmonary embolism (HCC)   Acute respiratory failure with hypoxia due to COVID-19 pneumonia: CXR with bilateral infiltrates.  Inflammatory markers markedly elevated.  Lactic acid and pro-Cal negative.  Requiring 6 L by HFNC to maintain saturation in low 90s. - Improved to RA today -> will do home O2 screen. - Received Actemra on 12/17. - Continue remdesivir.  12/17 - last dose will be 12/21. - s/p plasma 12/18 - continue steroids - CTA with PE with right heart strain and patchy bilateral infiltrates - continue heparin gtt -As needed inhalers, mucolytic's, antitussive and incentive spirometry.  COVID-19 Labs  Recent Labs    01/07/19 0130 01/08/19 0453 01/09/19 0500  DDIMER 11.25* 2.84* 3.32*  FERRITIN 815* 753* 907*  LDH 354* 315* 356*  CRP 29.5* 13.9* 8.5*    Lab Results  Component Value Date   SARSCOV2NAA POSITIVE (Jermani Pund) 01/05/2019   Submassive PE: Follow LE [place] (negative for  DVT - prelim) and echo (mildly reduced systolic function) Echo with mildly reduced RV systolic function, normal systolic function (see report) Will need f/u echo outpatient  Continue anticoagulation - needs 3-6 months and then discussion of long term anticoagulation.  provoked (by COVID) first time event, she does note fam hx of PE.  Start eliquis today.    Infectious diarrhea due to COVID-19 pneumonia -Management as above.  Essential hypertension: Normotensive -Continue HCTZ  Bradycardia: Asymptomatic.  EKG NSR.  She is not on nodal blocking agents. -Continue monitoring  Hypokalemia: Likely due to diarrhea and HCTZ.  Elevated liver enzymes: Resolved. -Continue monitoring  Hyperglycemia without diagnosis of diabetes: Likely due to steroid. -Check hemoglobin A1c -> 6.4, start SSI  DVT prophylaxis: heparin Code Status: full  Family Communication: none at bedside - discussed with daughter 12/18, no answer 12/19, discussed 12/20 Disposition Plan: pending further improvement   Consultants:   none  Procedures:  LE [place] Summary: Right: There is no evidence of deep vein thrombosis in the lower extremity. No cystic structure found in the popliteal fossa. Left: There is no evidence of deep vein thrombosis in the lower extremity. No cystic structure found in the popliteal fossa.  Echo IMPRESSIONS    1. Left ventricular ejection fraction, by visual estimation, is 65 to 70%. The left ventricle has normal function. Left ventricular septal wall thickness was mildly increased. Mildly increased left ventricular posterior wall thickness. There is no left  ventricular hypertrophy.  2. Left ventricular diastolic function could not be evaluated.  3. The left ventricle has no regional wall motion abnormalities.  4. Global right ventricle has mildly reduced systolic function.The right ventricular size is normal. No increase in right ventricular wall thickness.  5. Left atrial size was  normal.  6. Right atrial size was normal.  7. The mitral valve is normal in structure. No evidence of mitral valve regurgitation. No evidence of mitral stenosis.  8. The tricuspid valve is normal in structure. Tricuspid valve regurgitation is trivial.  9. The aortic valve is normal in structure. Aortic valve regurgitation is not visualized. No evidence of aortic valve sclerosis or stenosis. 10. The pulmonic valve was normal in structure. Pulmonic valve regurgitation is not visualized. 11. TR signal is inadequate for assessing pulmonary artery systolic pressure. 12. The inferior vena cava is normal in size with greater than 50% respiratory variability, suggesting right atrial pressure of 3 mmHg.  Antimicrobials:  Anti-infectives (From admission, onward)   Start     Dose/Rate Route Frequency Ordered Stop   01/07/19 1000  remdesivir 100 mg in sodium chloride 0.9 % 100 mL IVPB     100 mg 200 mL/hr over 30 Minutes Intravenous Daily 01/06/19 0157 01/11/19 0959   01/06/19 0200  remdesivir 200 mg in sodium chloride 0.9% 250 mL IVPB     200 mg 580 mL/hr over 30 Minutes Intravenous Once 01/06/19 0157 01/06/19 0317     Subjective: Feels progressively better  Objective: Vitals:   01/09/19 0600 01/09/19 0632 01/09/19 0700 01/09/19 1200  BP:   117/61 (!) 110/58  Pulse:   (!) 59 66  Resp:    20  Temp:    100 F (37.8 C)  TempSrc:    Oral  SpO2: 94% 96% 94% 99%  Weight:      Height:        Intake/Output Summary (Last 24 hours) at 01/09/2019 1336 Last data filed at 01/09/2019 0600 Gross per 24 hour  Intake 180 ml  Output --  Net 180 ml   Filed Weights   01/05/19 1407 01/06/19 0116 01/08/19 0424  Weight: 69.9 kg 67.4 kg 70.1 kg    Examination:  General: No acute distress. Cardiovascular: RRR Lungs: unlabored, no increased WOB Abdomen: Soft, nontender, nondistended Neurological: Alert and oriented 3. Moves all extremities 4. Cranial nerves II through XII grossly intact. Skin:  Warm and dry. No rashes or lesions. Extremities: No clubbing or cyanosis. No edema.   Data Reviewed: I have personally reviewed following labs and imaging studies  CBC: Recent Labs  Lab 01/05/19 1458 01/06/19 0319 01/07/19 0130 01/08/19 0453 01/09/19 0500  WBC 10.8* 7.5 12.0* 9.8 9.2  NEUTROABS 9.1*  --  9.8* 8.2* 7.1  HGB 13.4 12.3 12.5 11.3* 11.7*  HCT 39.9 38.2 37.9 34.5* 36.1  MCV 86.2 90.1 87.9 88.0 89.1  PLT 506* 362 430* 437* 240*   Basic Metabolic Panel: Recent Labs  Lab 01/05/19 1458 01/06/19 0319 01/07/19 0130 01/08/19 0453 01/09/19 0500  NA 135 138 140 141 142  K 3.0* 4.2 3.9 3.6 3.9  CL 97* 101 104 105 107  CO2 _0 GLUCOSE 113* 173* 135* 148* 142*  BUN 18 24* 33* 45* 40*  CREATININE 0.88 0.96 0.93 0.93 0.92  CALCIUM 8.8* 8.6* 9.1 8.9 8.9   GFR: Estimated Creatinine Clearance: 50.2 mL/min (by C-G formula based on SCr of 0.92 mg/dL). Liver Function Tests: Recent Labs  Lab 01/05/19 1458 01/06/19 0319 01/07/19 0130 01/08/19 0453 01/09/19 0500  AST 48* 41 39  29 27  ALT 31 34 36 32 32  ALKPHOS 110 107 108 96 93  BILITOT 1.0 1.1 0.5 0.4 0.4  PROT 7.6 6.8 7.0 6.4* 6.3*  ALBUMIN 3.2* 2.7* 2.8* 2.9* 2.8*   No results for input(s): LIPASE, AMYLASE in the last 168 hours. No results for input(s): AMMONIA in the last 168 hours. Coagulation Profile: Recent Labs  Lab 01/06/19 1950  INR 1.3*   Cardiac Enzymes: No results for input(s): CKTOTAL, CKMB, CKMBINDEX, TROPONINI in the last 168 hours. BNP (last 3 results) No results for input(s): PROBNP in the last 8760 hours. HbA1C: No results for input(s): HGBA1C in the last 72 hours. CBG: Recent Labs  Lab 01/08/19 1226 01/08/19 1754 01/09/19 0810 01/09/19 1247  GLUCAP 145* 125* 123* 149*   Lipid Profile: No results for input(s): CHOL, HDL, LDLCALC, TRIG, CHOLHDL, LDLDIRECT in the last 72 hours. Thyroid Function Tests: No results for input(s): TSH, T4TOTAL, FREET4, T3FREE, THYROIDAB in  the last 72 hours. Anemia Panel: Recent Labs    01/08/19 0453 01/09/19 0500  FERRITIN 753* 907*   Sepsis Labs: Recent Labs  Lab 01/05/19 1430 01/05/19 1458  PROCALCITON  --  <0.10  LATICACIDVEN 1.5  --     Recent Results (from the past 240 hour(s))  Blood Culture (routine x 2)     Status: None (Preliminary result)   Collection Time: 01/05/19  2:20 PM   Specimen: BLOOD  Result Value Ref Range Status   Specimen Description   Final    BLOOD LEFT ANTECUBITAL Performed at [facility], [address] [place], [place] [address]    Special Requests   Final    BOTTLES DRAWN AEROBIC ONLY Blood Culture adequate volume Performed at [facility], [address], [place], [place] [address]    Culture   Final    NO GROWTH 4 DAYS Performed at [facility], [place] [address] [place], [place] [address]    Report Status PENDING  Incomplete  Blood Culture (routine x 2)     Status: None (Preliminary result)   Collection Time: 01/05/19  2:25 PM   Specimen: BLOOD  Result Value Ref Range Status   Specimen Description   Final    BLOOD BLOOD LEFT WRIST Performed at [facility], [address] [place], [place] [address]    Special Requests   Final    BOTTLES DRAWN AEROBIC AND ANAEROBIC Blood Culture adequate volume Performed at [facility], [address] [place], [place] [address]    Culture   Final    NO GROWTH 4 DAYS Performed at [facility], [place] [address], [place], [place] [address]    Report Status PENDING  Incomplete  SARS Coronavirus 2 Ag (30 min TAT) - Nasal Swab (BD Veritor Kit)     Status: None   Collection Time: 01/05/19  2:59 PM   Specimen: Nasal Swab (BD Veritor Kit)  Result Value Ref Range Status   SARS Coronavirus 2 Ag NEGATIVE NEGATIVE Final    Comment: (NOTE) SARS-CoV-2 antigen NOT DETECTED.  Negative results are presumptive.  Negative results do not preclude SARS-CoV-2 infection and should not be used as  the sole basis for treatment or other patient management decisions, including infection  control decisions, particularly in the presence of clinical signs and  symptoms consistent with COVID-19, or in those who have been in contact with the virus.  Negative results must be combined with  clinical observations, patient history, and epidemiological information. The expected result is Negative. Fact Sheet for Patients: PodPark.tn Fact Sheet for Healthcare Providers: GiftContent.is This test is not yet approved or cleared by the [place] FDA and  has been authorized for detection and/or diagnosis of SARS-CoV-2 by FDA under an Emergency Use Authorization (EUA).  This EUA will remain in effect (meaning this test can be used) for the duration of  the COVID-19 de claration under Section 564(b)(1) of the Act, 21 U.S.C. section 360bbb-3(b)(1), unless the authorization is terminated or revoked sooner. Performed at [facility], [address] [place], [place] [address]   Respiratory Panel by RT PCR (Flu Milas Schappell&B, Covid) - Nasopharyngeal Swab     Status: Abnormal   Collection Time: 01/05/19  4:42 PM   Specimen: Nasopharyngeal Swab  Result Value Ref Range Status   SARS Coronavirus 2 by RT PCR POSITIVE (Hosey Burmester) NEGATIVE Final    Comment: RESULT CALLED TO, READ BACK BY AND VERIFIED WITH: K NEAL RN 01/05/19 1915 JDW (NOTE) SARS-CoV-2 target nucleic acids are DETECTED. SARS-CoV-2 RNA is generally detectable in upper respiratory specimens  during the acute phase of infection. Positive results are indicative of the presence of the identified virus, but do not rule out bacterial infection or co-infection with other pathogens not detected by the test. Clinical correlation with patient history and other diagnostic information is necessary to determine patient infection status. The expected result is Negative. Fact Sheet for Patients:    PinkCheek.be Fact Sheet for Healthcare Providers: GravelBags.it This test is not yet approved or cleared by the [place] FDA and  has been authorized for detection and/or diagnosis of SARS-CoV-2 by FDA under an Emergency Use Authorization (EUA).  This EUA will remain in effect (meaning this test can be used) for the d uration of  the COVID-19 declaration under Section 564(b)(1) of the Act, 21 U.S.C. section 360bbb-3(b)(1), unless the authorization is terminated or revoked sooner.    Influenza Able Malloy by PCR NEGATIVE NEGATIVE Final   Influenza B by PCR NEGATIVE NEGATIVE Final    Comment: (NOTE) The Xpert Xpress SARS-CoV-2/FLU/RSV assay is intended as an aid in  the diagnosis of influenza from Nasopharyngeal swab specimens and  should not be used as Neely Kammerer sole basis for treatment. Nasal washings and  aspirates are unacceptable for Xpert Xpress SARS-CoV-2/FLU/RSV  testing. Fact Sheet for Patients: PinkCheek.be Fact Sheet for Healthcare Providers: GravelBags.it This test is not yet approved or cleared by the [place] FDA and  has been authorized for detection and/or diagnosis of SARS-CoV-2 by  FDA under an Emergency Use Authorization (EUA). This EUA will remain  in effect (meaning this test can be used) for the duration of the  Covid-19 declaration under Section 564(b)(1) of the Act, 21  U.S.C. section 360bbb-3(b)(1), unless the authorization is  terminated or revoked. Performed at [facility], [place] [address], [place], [place] [address]          Radiology Studies: VAS [place] LOWER EXTREMITY VENOUS (DVT)  Result Date: 01/08/2019  Lower Venous Study Indications: Pulmonary embolism.  Comparison Study: no prior Performing Technologist: Abram Sander RVS  Examination Guidelines: Aliegha Paullin complete evaluation includes B-mode imaging, spectral Doppler, color Doppler, and power  Doppler as needed of all accessible portions of each vessel. Bilateral testing is considered an integral part of Braydin Aloi complete examination. Limited examinations for reoccurring indications may be performed as noted.  +---------+---------------+---------+-----------+----------+--------------+ RIGHT    CompressibilityPhasicitySpontaneityPropertiesThrombus Aging +---------+---------------+---------+-----------+----------+--------------+ CFV  Full           Yes      Yes                                 +---------+---------------+---------+-----------+----------+--------------+ SFJ      Full                                                        +---------+---------------+---------+-----------+----------+--------------+ FV Prox  Full                                                        +---------+---------------+---------+-----------+----------+--------------+ FV Mid   Full                                                        +---------+---------------+---------+-----------+----------+--------------+ FV DistalFull                                                        +---------+---------------+---------+-----------+----------+--------------+ PFV      Full                                                        +---------+---------------+---------+-----------+----------+--------------+ POP      Full           Yes      Yes                                 +---------+---------------+---------+-----------+----------+--------------+ PTV      Full                                                        +---------+---------------+---------+-----------+----------+--------------+ PERO     Full                                                        +---------+---------------+---------+-----------+----------+--------------+   +---------+---------------+---------+-----------+----------+--------------+ LEFT      CompressibilityPhasicitySpontaneityPropertiesThrombus Aging +---------+---------------+---------+-----------+----------+--------------+ CFV      Full           Yes      Yes                                 +---------+---------------+---------+-----------+----------+--------------+  SFJ      Full                                                        +---------+---------------+---------+-----------+----------+--------------+ FV Prox  Full                                                        +---------+---------------+---------+-----------+----------+--------------+ FV Mid   Full                                                        +---------+---------------+---------+-----------+----------+--------------+ FV DistalFull                                                        +---------+---------------+---------+-----------+----------+--------------+ PFV      Full                                                        +---------+---------------+---------+-----------+----------+--------------+ POP      Full           Yes      Yes                                 +---------+---------------+---------+-----------+----------+--------------+ PTV      Full                                                        +---------+---------------+---------+-----------+----------+--------------+ PERO     Full                                                        +---------+---------------+---------+-----------+----------+--------------+     Summary: Right: There is no evidence of deep vein thrombosis in the lower extremity. No cystic structure found in the popliteal fossa. Left: There is no evidence of deep vein thrombosis in the lower extremity. No cystic structure found in the popliteal fossa.  *See table(s) above for measurements and observations. Electronically signed by Harold Barban MD on 01/08/2019 at 4:44:02 PM.    Final         Scheduled Meds: .  sodium chloride   Intravenous Once  . apixaban  10 mg Oral BID   Followed by  . [START ON 01/16/2019] apixaban  5 mg Oral BID  .  insulin aspart  0-9 Units Subcutaneous TID WC  . mouth rinse  15 mL Mouth Rinse BID  . methylPREDNISolone (SOLU-MEDROL) injection  40 mg Intravenous Q12H  . pantoprazole  40 mg Oral Daily   Continuous Infusions: . remdesivir 100 mg in NS 100 mL 100 mg (01/09/19 1039)     LOS: 4 days    Time spent: over 85 min    Fayrene Helper, MD [facility] Pager AMION  If 7PM-7AM, please contact night-coverage [email or web address] Password [email or web address] 01/09/2019, 1:36 PM

## 2019-01-09 NOTE — Discharge Instructions (Addendum)
You should quarantine for 21 days since the start of your symptoms.  Please continue to quarantine for another 14 days after discharge (your quarantine will end on 01/24/2019 if your symptoms resolve without the use of medications.  Please ask the health department or your PCP if you have questions.     Person Under Monitoring Name: Anna Page  Location: [place] [address]   Infection Prevention Recommendations for Individuals Confirmed to have, or Being Evaluated for, 2019 Novel Coronavirus (COVID-19) Infection Who Receive Care at Home  Individuals who are confirmed to have, or are being evaluated for, COVID-19 should follow the prevention steps below until Ryn Peine healthcare provider or local or state health department says they can return to normal activities.  Stay home except to get medical care You should restrict activities outside your home, except for getting medical care. Do not go to work, school, or public areas, and do not use public transportation or taxis.  Call ahead before visiting your doctor Before your medical appointment, call the healthcare provider and tell them that you have, or are being evaluated for, COVID-19 infection. This will help the healthcare provider's office take steps to keep other people from getting infected. Ask your healthcare provider to call the local or state health department.  Monitor your symptoms Seek prompt medical attention if your illness is worsening (e.g., difficulty breathing). Before going to your medical appointment, call the healthcare provider and tell them that you have, or are being evaluated for, COVID-19 infection. Ask your healthcare provider to call the local or state health department.  Wear [place] facemask You should wear Mecca Guitron facemask that covers your nose and mouth when you are in the same room with other people and when you visit Rabecka Brendel healthcare provider. People who live with or visit you should also wear Keva Darty  facemask while they are in the same room with you.  Separate yourself from other people in your home As much as possible, you should stay in Rhena Glace different room from other people in your home. Also, you should use Gracie Gupta separate bathroom, if available.  Avoid sharing household items You should not share dishes, drinking glasses, cups, eating utensils, towels, bedding, or other items with other people in your home. After using these items, you should wash them thoroughly with soap and water.  Cover your coughs and sneezes Cover your mouth and nose with Lancelot Alyea tissue when you cough or sneeze, or you can cough or sneeze into your sleeve. Throw used tissues in Stanford Strauch lined trash can, and immediately wash your hands with soap and water for at least 20 seconds or use an alcohol-based hand rub.  Wash your [facility] your hands often and thoroughly with soap and water for at least 20 seconds. You can use an alcohol-based hand sanitizer if soap and water are not available and if your hands are not visibly dirty. Avoid touching your eyes, nose, and mouth with unwashed hands.   Prevention Steps for Caregivers and Household Members of Individuals Confirmed to have, or Being Evaluated for, COVID-19 Infection Being Cared for in the Home  If you live with, or provide care at home for, Arlie Posch person confirmed to have, or being evaluated for, COVID-19 infection please follow these guidelines to prevent infection:  Follow healthcare provider's instructions Make sure that you understand and can help the patient follow any healthcare provider instructions for all care.  Provide for the patient's basic needs You should help the patient  with basic needs in the home and provide support for getting groceries, prescriptions, and other personal needs.  Monitor the patient's symptoms If they are getting sicker, call his or her medical provider and tell them that the patient has, or is being evaluated for, COVID-19 infection.  This will help the healthcare provider's office take steps to keep other people from getting infected. Ask the healthcare provider to call the local or state health department.  Limit the number of people who have contact with the patient  If possible, have only one caregiver for the patient.  Other household members should stay in another home or place of residence. If this is not possible, they should stay  in another room, or be separated from the patient as much as possible. Use Jahmil Macleod separate bathroom, if available.  Restrict visitors who do not have an essential need to be in the home.  Keep older adults, very young children, and other sick people away from the patient Keep older adults, very young children, and those who have compromised immune systems or chronic health conditions away from the patient. This includes people with chronic heart, lung, or kidney conditions, diabetes, and cancer.  Ensure good ventilation Make sure that shared spaces in the home have good air flow, such as from an air conditioner or an opened window, weather permitting.  Wash your hands often  Wash your hands often and thoroughly with soap and water for at least 20 seconds. You can use an alcohol based hand sanitizer if soap and water are not available and if your hands are not visibly dirty.  Avoid touching your eyes, nose, and mouth with unwashed hands.  Use disposable paper towels to dry your hands. If not available, use dedicated cloth towels and replace them when they become wet.  Wear Branden Shallenberger facemask and gloves  Wear Magnum Lunde disposable facemask at all times in the room and gloves when you touch or have contact with the patient's blood, body fluids, and/or secretions or excretions, such as sweat, saliva, sputum, nasal mucus, vomit, urine, or feces.  Ensure the mask fits over your nose and mouth tightly, and do not touch it during use.  Throw out disposable facemasks and gloves after using them. Do not  reuse.  Wash your hands immediately after removing your facemask and gloves.  If your personal clothing becomes contaminated, carefully remove clothing and launder. Wash your hands after handling contaminated clothing.  Place all used disposable facemasks, gloves, and other waste in Kashmir Lysaght lined container before disposing them with other household waste.  Remove gloves and wash your hands immediately after handling these items.  Do not share dishes, glasses, or other household items with the patient  Avoid sharing household items. You should not share dishes, drinking glasses, cups, eating utensils, towels, bedding, or other items with Conny Situ patient who is confirmed to have, or being evaluated for, COVID-19 infection.  After the person uses these items, you should wash them thoroughly with soap and water.  Wash laundry thoroughly  Immediately remove and wash clothes or bedding that have blood, body fluids, and/or secretions or excretions, such as sweat, saliva, sputum, nasal mucus, vomit, urine, or feces, on them.  Wear gloves when handling laundry from the patient.  Read and follow directions on labels of laundry or clothing items and detergent. In general, wash and dry with the warmest temperatures recommended on the label.  Clean all areas the individual has used often  Clean all touchable surfaces, such as counters,  tabletops, doorknobs, bathroom fixtures, toilets, phones, keyboards, tablets, and bedside tables, every day. Also, clean any surfaces that may have blood, body fluids, and/or secretions or excretions on them.  Wear gloves when cleaning surfaces the patient has come in contact with.  Use Sinia Antosh diluted bleach solution (e.g., dilute bleach with 1 part bleach and 10 parts water) or Bethany Hirt household disinfectant with Alistair Senft label that says EPA-registered for coronaviruses. To make Coila Wardell bleach solution at home, add 1 tablespoon of bleach to 1 quart (4 cups) of water. For Hadlei Stitt larger supply, add  cup of  bleach to 1 gallon (16 cups) of water.  Read labels of cleaning products and follow recommendations provided on product labels. Labels contain instructions for safe and effective use of the cleaning product including precautions you should take when applying the product, such as wearing gloves or eye protection and making sure you have good ventilation during use of the product.  Remove gloves and wash hands immediately after cleaning.  Monitor yourself for signs and symptoms of illness Caregivers and household members are considered close contacts, should monitor their health, and will be asked to limit movement outside of the home to the extent possible. Follow the monitoring steps for close contacts listed on the symptom monitoring form.   ? If you have additional questions, contact your local health department or call the epidemiologist on call at [phone] (available 24/7). ? This guidance is subject to change. For the most up-to-date guidance from [facility], please refer to their website: TripMetro.huhttps://www.cdc.gov/coronavirus/2019-ncov/hcp/guidance-prevent-spread.html  Information on my medicine - ELIQUIS (apixaban)  This medication education was reviewed with me or my healthcare representative as part of my discharge preparation.  The pharmacist that spoke with me during my hospital stay was:  Lawerance BachMegan L McCarthy, [facility]  Why was Eliquis prescribed for you? Eliquis was prescribed to treat blood clots that may have been found in the veins of your legs (deep vein thrombosis) or in your lungs (pulmonary embolism) and to reduce the risk of them occurring again.  What do You need to know about Eliquis ? The starting dose is 10 mg (two 5 mg tablets) taken TWICE daily for the FIRST SEVEN (7) DAYS, then the dose is reduced to ONE 5 mg tablet taken TWICE daily.  Eliquis may be taken with or without food.   Try to take the dose about the same time in the morning and in the evening. If you have  difficulty swallowing the tablet whole please discuss with your pharmacist how to take the medication safely.  Take Eliquis exactly as prescribed and DO NOT stop taking Eliquis without talking to the doctor who prescribed the medication.  Stopping may increase your risk of developing Trevyon Swor new blood clot.  Refill your prescription before you run out.  After discharge, you should have regular check-up appointments with your healthcare provider that is prescribing your Eliquis.    What do you do if you miss Jazmyne Beauchesne dose? If Johan Creveling dose of ELIQUIS is not taken at the scheduled time, take it as soon as possible on the same day and twice-daily administration should be resumed. The dose should not be doubled to make up for Joe Gee missed dose.  Important Safety Information Jaylene Arrowood possible side effect of Eliquis is bleeding. You should call your healthcare provider right away if you experience any of the following: ? Bleeding from an injury or your nose that does not stop. ? Unusual colored urine (red or dark brown) or unusual colored stools (red  or black). ? Unusual bruising for unknown reasons. ? Curties Conigliaro serious fall or if you hit your head (even if there is no bleeding).  Some medicines may interact with Eliquis and might increase your risk of bleeding or clotting while on Eliquis. To help avoid this, consult your healthcare provider or pharmacist prior to using any new prescription or non-prescription medications, including herbals, vitamins, non-steroidal anti-inflammatory drugs (NSAIDs) and supplements.  This website has more information on Eliquis (apixaban): http://www.eliquis.com/eliquis/home

## 2019-01-09 NOTE — Progress Notes (Signed)
ANTICOAGULATION CONSULT NOTE - Follow Up Consult  Pharmacy Consult for heparin Indication: pulmonary embolus  Patient Measurements: Height: 5\' 1"  (154.9 cm) Weight: 154 lb 8.7 oz (70.1 kg) IBW/kg (Calculated) : 47.8 Heparin Dosing Weight: 62 kg  Labs: Recent Labs    01/06/19 1950 01/07/19 0130 01/07/19 0540 01/07/19 1503 01/08/19 0012 01/08/19 0453  HGB  --  12.5  --   --   --  11.3*  HCT  --  37.9  --   --   --  34.5*  PLT  --  430*  --   --   --  437*  APTT 35  --   --   --   --   --   LABPROT 15.7*  --   --   --   --   --   INR 1.3*  --   --   --   --   --   HEPARINUNFRC  --  0.25* 0.28* 0.51 0.52  --   CREATININE  --  0.93  --   --   --  0.93     Medications:  Infusions:  . heparin 1,100 Units/hr (01/08/19 2019)  . remdesivir 100 mg in NS 100 mL 100 mg (01/08/19 1021)    Assessment: 73yoF admitted on 12/16 with COVID-19 pneumonia.  CTA with PE with right heart strain and patchy bilateral infiltrates.  Pharmacy was consulted to dose Heparin IV for new PE. No prior to admission anticoagulation.  No evidence of DVT.  Today, 01/09/2019 HL 1.3, suspect sample may have been contaminated if drawn on the left side.  RN to check. CBC: Hgb low/stable, Plt elevated. Per RN, no signs or symptoms of bleeding and no issues with infusion site.  Goal of Therapy:  Heparin level 0.3-0.7 units/ml Monitor platelets by anticoagulation protocol: Yes   Plan:  STAT heparin level re-draw HOLD heparin IV infusion Daily CBC, heparin level  Monitor for signs/symptoms of bleeding. Follow up long-term anticoagulation plans.  Gretta Arab PharmD, BCPS Clinical pharmacist phone 7am- 5pm: [phone] 01/09/2019 7:18 AM

## 2019-01-09 NOTE — Care Management (Signed)
Benefit check submitted for DOACs, will result Monday

## 2019-01-10 LAB — D-DIMER, QUANTITATIVE: D-Dimer, Quant: 8.04 ug/mL-FEU — ABNORMAL HIGH (ref 0.00–0.50)

## 2019-01-10 LAB — COMPREHENSIVE METABOLIC PANEL
ALT: 32 U/L (ref 0–44)
AST: 28 U/L (ref 15–41)
Albumin: 3.2 g/dL — ABNORMAL LOW (ref 3.5–5.0)
Alkaline Phosphatase: 87 U/L (ref 38–126)
Anion gap: 12 (ref 5–15)
BUN: 39 mg/dL — ABNORMAL HIGH (ref 8–23)
CO2: 23 mmol/L (ref 22–32)
Calcium: 9.2 mg/dL (ref 8.9–10.3)
Chloride: 107 mmol/L (ref 98–111)
Creatinine, Ser: 0.98 mg/dL (ref 0.44–1.00)
GFR calc Af Amer: >60 mL/min (ref >60)
GFR calc non Af Amer: 58 mL/min — ABNORMAL LOW (ref >60)
Glucose, Bld: 75 mg/dL (ref 70–99)
Potassium: 3.8 mmol/L (ref 3.5–5.1)
Sodium: 142 mmol/L (ref 135–145)
Total Bilirubin: 0.7 mg/dL (ref 0.3–1.2)
Total Protein: 6.6 g/dL (ref 6.5–8.1)

## 2019-01-10 LAB — CBC WITH DIFFERENTIAL/PLATELET
Abs Immature Granulocytes: 0.64 10*3/uL — ABNORMAL HIGH (ref 0.00–0.07)
Basophils Absolute: 0.2 10*3/uL — ABNORMAL HIGH (ref 0.0–0.1)
Basophils Relative: 1 %
Eosinophils Absolute: 0.2 10*3/uL (ref 0.0–0.5)
Eosinophils Relative: 2 %
HCT: 38.9 % (ref 36.0–46.0)
Hemoglobin: 12.6 g/dL (ref 12.0–15.0)
Immature Granulocytes: 5 %
Lymphocytes Relative: 27 %
Lymphs Abs: 3.9 10*3/uL (ref 0.7–4.0)
MCH: 28.8 pg (ref 26.0–34.0)
MCHC: 32.4 g/dL (ref 30.0–36.0)
MCV: 88.8 fL (ref 80.0–100.0)
Monocytes Absolute: 1.4 10*3/uL — ABNORMAL HIGH (ref 0.1–1.0)
Monocytes Relative: 10 %
Neutro Abs: 7.9 10*3/uL — ABNORMAL HIGH (ref 1.7–7.7)
Neutrophils Relative %: 55 %
Platelets: 424 10*3/uL — ABNORMAL HIGH (ref 150–400)
RBC: 4.38 MIL/uL (ref 3.87–5.11)
RDW: 12.8 % (ref 11.5–15.5)
WBC: 14.1 10*3/uL — ABNORMAL HIGH (ref 4.0–10.5)
nRBC: 0.3 % — ABNORMAL HIGH (ref 0.0–0.2)

## 2019-01-10 LAB — CULTURE, BLOOD (ROUTINE X 2)
Culture: NO GROWTH
Culture: NO GROWTH
Special Requests: ADEQUATE
Special Requests: ADEQUATE

## 2019-01-10 LAB — GLUCOSE, CAPILLARY
Glucose-Capillary: 80 mg/dL (ref 70–99)
Glucose-Capillary: 148 mg/dL — ABNORMAL HIGH (ref 70–99)

## 2019-01-10 LAB — LACTATE DEHYDROGENASE: LDH: 401 U/L — ABNORMAL HIGH (ref 98–192)

## 2019-01-10 LAB — FERRITIN: Ferritin: 1062 ng/mL — ABNORMAL HIGH (ref 11–307)

## 2019-01-10 LAB — C-REACTIVE PROTEIN: CRP: 6.3 mg/dL — ABNORMAL HIGH (ref <1.0)

## 2019-01-10 MED ORDER — ELIQUIS DVT/PE STARTER PACK 5 MG PO TBPK: ORAL_TABLET | ORAL | 0 refills | Status: AC

## 2019-01-10 NOTE — TOC Benefit Eligibility Note (Signed)
Transition of Care [facility]) Benefit Eligibility Note    Patient Details  Name: Anna Page MRN: [id number] Date of Birth: [birth year]   Medication/Dose: Alveda Reasons  Covered?: Yes     Prescription Coverage Preferred Pharmacy: Most major retail pharmacies  Spoke with Person/Company/Phone Number:: [employer]  Co-Pay: $45 for 30 day retail  Prior Approval: No          Delorse Lek Phone Number: 01/10/2019, 1:24 PM

## 2019-01-10 NOTE — TOC Benefit Eligibility Note (Signed)
Transition of Care [facility]) Benefit Eligibility Note    Patient Details  Name: Anna Page MRN: [id number] Date of Birth: 05-30-1945   Medication/Dose: Eliquis  Covered?: Yes     Prescription Coverage Preferred Pharmacy: Most major retail pharmacies  Spoke with Person/Company/Phone Number:: [employer]  Co-Pay: $45 for 30 day retail  Prior Approval: No     Additional Notes: 10mg  Eliquis not found in thier system    [employer] Phone Number: 01/10/2019, 1:25 PM

## 2019-01-10 NOTE — Progress Notes (Deleted)
Pt ambulate on floor spo2 80% on rm air. Ambulate on floor with 2L [place] spo2 90%

## 2019-01-10 NOTE — Progress Notes (Signed)
SATURATION QUALIFICATIONS: (This note is used to comply with regulatory documentation for home oxygen)  Patient Saturations on Room Air at Rest = 93  Patient Saturations on Room Air while Ambulating = 80  Patient Saturations on 2 Liters of oxygen while Ambulating = 92  Please briefly explain why patient needs home oxygen: Patient would benefit using oxygen when doing activities like walking and exercising.

## 2019-01-10 NOTE — Discharge Summary (Signed)
Physician Discharge Summary  Anna Page DPO:[id number] DOB: 05-20-1945 DOA: 01/05/2019  PCP: Burman Freestone, MD  Admit date: 01/05/2019 Discharge date: 01/10/2019  Time spent: 40 minutes  Recommendations for Outpatient Follow-up:  1. Follow outpatient CBC/CMP 2. Continue eliquis for 3-6 months.  Will need to discuss with PCP after regarding long term need for anticoagulation.  Suspect given provoked 1 time event, can likely be discontinued, but instructed to f/u with PCP regarding long term plan. 3. Follow repeat echo as outpatient, RV systolic function mildly reduced here. 4. Follow O2 needs outpatient   5. Follow prediabetes outpatient  6. Follow CXR outpatient 7. Quarantine per  [employer] - her test was positive on 12/16, she reports symptoms before presentation (on 12/4).  Will be conservative and have her quarantine 14 days after discharge since the test was obtained almost 2 weeks after sx started.  Discharge Diagnoses:  Principal Problem:   Acute respiratory disease due to COVID-19 virus Active Problems:   Multifocal pneumonia   Hypokalemia   Acute respiratory failure with hypoxia (HCC)   Acute pulmonary embolism ([place])   Discharge Condition: stable  Diet recommendation: heart healthy  Filed Weights   01/06/19 0116 01/08/19 0424 01/09/19 0500  Weight: 67.4 kg 70.1 kg 70.1 kg    History of present illness:  73 year old female with history of HTNandlower extremity edema on HCTZ presenting with progressive shortness of breath and flulike illness for 10 days.Had 1-2 watery BMs daily during the same period of time.  In ED, COVID-19 positive. Chest x-ray with bilateral infiltrates. Desaturated to 88% on RA requiring 6 L by HFNC to maintain saturation in mid 90s. Slightly tachypneic. WBC 10.8. K3.0. AG 16. Bicarb 22. AST 48. Inflammatory markers markedly elevated. High-sensitivity troponin negative. EKG NSR. Lactic acid and pro Cal within normal.  Admitted for acute hypoxic respiratory failure due to COVID-19 pneumonia. Started on Decadron, remdesivir and Actemra.  She was admitted for AHRF 2/2 COVID pneumonia.  She improved with steroids, remdesivir, actemra, and plasma.  She was discharged on 12/21 with 2 L O2 to use with activity.  She was also found to have PE and discharged on eliquis.  See below for additional details  Hospital Course:  Acute respiratory failure with hypoxia due to COVID-19 pneumonia:CXR with bilateral infiltrates. Inflammatory markers markedly elevated. Lactic acid and pro-Cal negative. Requiring 6 L by HFNC to maintain saturation in low 90s. - Improved to RA today -> will do home O2 screen. -> requires 2 L with activity, will discharge with O2 with activity and at night - Received Actemra on 12/17. - Continue remdesivir.12/17 - last dose will be 12/21. - s/p plasma 12/18 - continue steroids - CTA with PE with right heart strain and patchy bilateral infiltrates - continue heparin gtt -As needed inhalers, mucolytic's, antitussive and incentive spirometry  Submassive PE: Follow LE [place] (negative for DVT - prelim) and echo (mildly reduced systolic function) Echo with mildly reduced RV systolic function, normal systolic function (see report) Will need f/u echo outpatient  Continue anticoagulation - needs 3-6 months and then discussion of long term anticoagulation.  provoked (by COVID) first time event, she does note fam hx of PE,.  Likely will be able to discontinue anticoagulation after 3-6 months.  Continue eliquis outpatient.  Infectious diarrhea due to COVID-19 pneumonia -Management as above.  Essential hypertension: Normotensive -Continue HCTZ  Bradycardia: Asymptomatic. EKG NSR.She is not on nodal blocking agents. -Continue monitoring  Hypokalemia: Likely due to diarrhea and HCTZ.  Elevated liver enzymes: Resolved. -Continue monitoring  Hyperglycemia without diagnosis of diabetes:  Likely due to steroid. -Check hemoglobin A1c -> 6.4 - prediabetes, follow outpatinet  Procedures: LE [place] Summary: Right: There is no evidence of deep vein thrombosis in the lower extremity. No cystic structure found in the popliteal fossa. Left: There is no evidence of deep vein thrombosis in the lower extremity. No cystic structure found in the popliteal fossa.  Echo IMPRESSIONS    1. Left ventricular ejection fraction, by visual estimation, is 65 to 70%. The left ventricle has normal function. Left ventricular septal wall thickness was mildly increased. Mildly increased left ventricular posterior wall thickness. There is no left  ventricular hypertrophy.  2. Left ventricular diastolic function could not be evaluated.  3. The left ventricle has no regional wall motion abnormalities.  4. Global right ventricle has mildly reduced systolic function.The right ventricular size is normal. No increase in right ventricular wall thickness.  5. Left atrial size was normal.  6. Right atrial size was normal.  7. The mitral valve is normal in structure. No evidence of mitral valve regurgitation. No evidence of mitral stenosis.  8. The tricuspid valve is normal in structure. Tricuspid valve regurgitation is trivial.  9. The aortic valve is normal in structure. Aortic valve regurgitation is not visualized. No evidence of aortic valve sclerosis or stenosis. 10. The pulmonic valve was normal in structure. Pulmonic valve regurgitation is not visualized. 11. TR signal is inadequate for assessing pulmonary artery systolic pressure. 12. The inferior vena cava is normal in size with greater than 50% respiratory variability, suggesting right atrial pressure of 3 mmHg.  Consultations:  none  Discharge Exam: Vitals:   01/10/19 1240 01/10/19 1300  BP:    Pulse:    Resp:    Temp:    SpO2: (!) 80% 91%   Feels better.  Comfortable going home. Discussed discharge plan and recommendations Discussed  plan of care with daughter as well  General: No acute distress. Cardiovascular:RRR Lungs: Clear to auscultation bilaterally with good air movement.  Abdomen: Soft, nontender, nondistended  Neurological: Alert and oriented 3. Moves all extremities 4. Cranial nerves II through XII grossly intact. Skin: Warm and dry. No rashes or lesions. Extremities: No clubbing or cyanosis. No edema.   Discharge Instructions   Discharge Instructions    Call MD for:  difficulty breathing, headache or visual disturbances   Complete by: As directed    Call MD for:  extreme fatigue   Complete by: As directed    Call MD for:  hives   Complete by: As directed    Call MD for:  persistant dizziness or light-headedness   Complete by: As directed    Call MD for:  persistant nausea and vomiting   Complete by: As directed    Call MD for:  redness, tenderness, or signs of infection (pain, swelling, redness, odor or green/yellow discharge around incision site)   Complete by: As directed    Call MD for:  severe uncontrolled pain   Complete by: As directed    Call MD for:  temperature >100.4   Complete by: As directed    Diet - low sodium heart healthy   Complete by: As directed    Diet - low sodium heart healthy   Complete by: As directed    Discharge instructions   Complete by: As directed    You were seen for COVID 19 pneumonia.  You've improved with steroids, remdesivir, antibiotics, plasma, and actemra.  You had Lylie Blacklock pulmonary embolism (blood clot in your lung).  You've been started on eliquis.  Continue this when you are discharged home.  Today is day 2 of your eliquis (we're discharging you with the started pack).  This should be continued for 3-6 months.  After 3-6 months of uninterrupted therapy, please discuss long term antiocoagulation plans with your PCP.  Your echo had mild abnormalities that should be followed outpatient in [place] few months.  You will need to quarantine for 21 days after your symptoms  started.  Since your symptoms started before your admission, we'll plan to continue your quarantine for an additional 14 days after hospital discharge.  Your quarantine can end on 01/23/2018 as long as you continue to improve and have no symptoms without taking medications.  Please call the health department or your PCP if you have questions.  We will send you home with oxygen to use with activity and while you are asleep.  Please follow up with your PCP as an outpatient to determine how long you'll need this.  Return for new, recurrent, or worsening symptoms.  Please ask your PCP to request records from this hospitalization so they know what was done and what the next steps will be.   Increase activity slowly   Complete by: As directed    Increase activity slowly   Complete by: As directed    MyChart COVID-19 home monitoring program   Complete by: Jan 10, 2019    Is the patient willing to use the [facility] for home monitoring?: Yes   Temperature monitoring   Complete by: Jan 10, 2019    After how many days would you like to receive Ed Rayson notification of this patient's flowsheet entries?: 1     Allergies as of 01/10/2019      Reactions   Prednisone Nausea Only   lightheaded      Medication List    TAKE these medications   docusate sodium 100 MG capsule Commonly known as: COLACE Take 100 mg by mouth daily.   Eliquis DVT/PE Starter Pack 5 MG Tbpk Generic drug: Apixaban Starter Pack Take as directed on package: start with two-'5mg'$  tablets twice daily for 7 days. On day 8, switch to one-'5mg'$  tablet twice daily.   ferrous sulfate 325 (65 FE) MG tablet Take 325 mg by mouth daily with breakfast.   hydrochlorothiazide 25 MG tablet Commonly known as: HYDRODIURIL Take 25 mg by mouth daily.   multivitamin with minerals Tabs tablet Take 1 tablet by mouth daily.      Allergies  Allergen Reactions  . Prednisone Nausea Only    lightheaded   Follow-up Information    Burman Freestone, MD Follow up.   Specialty: Internal Medicine Contact information: [address] [place] [address] [phone]            The results of significant diagnostics from this hospitalization (including imaging, microbiology, ancillary and laboratory) are listed below for reference.    Significant Diagnostic Studies: CT ANGIO CHEST PE W OR WO CONTRAST  Result Date: 01/06/2019 CLINICAL DATA:  Shortness of breath.  Coronavirus infection. EXAM: CT ANGIOGRAPHY CHEST WITH CONTRAST TECHNIQUE: Multidetector CT imaging of the chest was performed using the standard protocol during bolus administration of intravenous contrast. Multiplanar CT image reconstructions and MIPs were obtained to evaluate the vascular anatomy. CONTRAST:  55m OMNIPAQUE IOHEXOL 350 MG/ML SOLN COMPARISON:  Radiography 01/05/2019 FINDINGS: Cardiovascular: Pulmonary arterial opacification is good. Large pulmonary  emboli on the right affecting the right middle and lower lobe pulmonary arteries. No emboli seen on the left. Right ventricular to left ventricular ratio is 1. Mediastinum/Nodes: No mediastinal or hilar mass or lymphadenopathy. Lungs/Pleura: Widespread bilateral patchy pulmonary infiltrates consistent with viral pneumonia. No pleural effusion or lobar collapse. Upper Abdomen: Stones and or sludge in the gallbladder. Cyst of the left lobe of the liver. Otherwise negative. Musculoskeletal: Negative Review of the MIP images confirms the above findings. IMPRESSION: Positive for pulmonary emboli in the right middle and lower lobe pulmonary arteries. Right ventricular to left ventricular ratio is 1. evidence of right heart strain consistent with at least submassive (intermediate risk) PE. This is only slightly above the normal limits of 0.9. The presence of right heart strain has been associated with an increased risk of morbidity and mortality. Please see epic order set. Widespread bilateral patchy  pulmonary infiltrates consistent with viral pneumonia as stated clinically. Call report in progress. Electronically Signed   By: Nelson Chimes M.D.   On: 01/06/2019 19:04   DG CHEST PORT 1 VIEW  Result Date: 01/07/2019 CLINICAL DATA:  Hypoxia. EXAM: PORTABLE CHEST 1 VIEW COMPARISON:  01/06/2019 FINDINGS: Normal heart size. No pleural effusion. No interstitial edema. Bilateral peripheral and lower lung zone predominant pulmonary opacities are again noted in appears similar to previous exam. The visualized osseous structures are unremarkable. IMPRESSION: 1. No change in aeration to the lungs compared with previous exam. Electronically Signed   By: Kerby Moors M.D.   On: 01/07/2019 11:23   DG Chest Port 1 View  Result Date: 01/05/2019 CLINICAL DATA:  Shortness of breath.  Flu like symptoms.  Hypoxia. EXAM: PORTABLE CHEST 1 VIEW COMPARISON:  None. FINDINGS: The cardiomediastinal silhouette is normal. Bilateral patchy pulmonary infiltrates, most prominent in the right base. No other acute abnormalities are identified. IMPRESSION: Bilateral pulmonary infiltrates, most prominent the right base, consistent with pneumonia. Atypical infections should be considered given the appearance. Electronically Signed   By: Dorise Bullion III M.D   On: 01/05/2019 14:43   ECHOCARDIOGRAM COMPLETE  Result Date: 01/07/2019   ECHOCARDIOGRAM REPORT   Patient Name:   Charmel Brenton Date of Exam: 01/07/2019 Medical Rec #:  [id number]        Height:       2.8 in Accession #:    [id number]       Weight:       170.0 lb Date of Birth:  [birth year]        BSA:          0.19 m Patient Age:    73 years         BP:           109/54 mmHg Patient Gender: F                HR:           61 bpm. Exam Location:  Inpatient Procedure: 2D Echo, Cardiac Doppler and Color Doppler Indications:    I26.02 Pulmonary embolus  History:        Patient has no prior history of Echocardiogram examinations.                 Covid 19 positive. Pneumonia.  Hypoxia. Respiratory failure.  Sonographer:    Roseanna Rainbow RDCS Referring Phys: [phone] [place] CALDWELL POWELL JR  Sonographer Comments: Image acquisition challenging due to respiratory motion. IMPRESSIONS  1. Left ventricular ejection fraction, by visual estimation, is 65 to 70%. The  left ventricle has normal function. Left ventricular septal wall thickness was mildly increased. Mildly increased left ventricular posterior wall thickness. There is no left ventricular hypertrophy.  2. Left ventricular diastolic function could not be evaluated.  3. The left ventricle has no regional wall motion abnormalities.  4. Global right ventricle has mildly reduced systolic function.The right ventricular size is normal. No increase in right ventricular wall thickness.  5. Left atrial size was normal.  6. Right atrial size was normal.  7. The mitral valve is normal in structure. No evidence of mitral valve regurgitation. No evidence of mitral stenosis.  8. The tricuspid valve is normal in structure. Tricuspid valve regurgitation is trivial.  9. The aortic valve is normal in structure. Aortic valve regurgitation is not visualized. No evidence of aortic valve sclerosis or stenosis. 10. The pulmonic valve was normal in structure. Pulmonic valve regurgitation is not visualized. 11. TR signal is inadequate for assessing pulmonary artery systolic pressure. 12. The inferior vena cava is normal in size with greater than 50% respiratory variability, suggesting right atrial pressure of 3 mmHg. FINDINGS  Left Ventricle: Left ventricular ejection fraction, by visual estimation, is 65 to 70%. The left ventricle has normal function. The left ventricle has no regional wall motion abnormalities. Mildly increased left ventricular posterior wall thickness. There is no left ventricular hypertrophy. The left ventricular diastology could not be evaluated due to nondiagnostic images. Left ventricular diastolic function could not be evaluated. Not assessed.  Right Ventricle: The right ventricular size is normal. No increase in right ventricular wall thickness. Global RV systolic function is has mildly reduced systolic function. Left Atrium: Left atrial size was normal in size. Right Atrium: Right atrial size was normal in size Pericardium: There is no evidence of pericardial effusion. Mitral Valve: The mitral valve is normal in structure. No evidence of mitral valve regurgitation. No evidence of mitral valve stenosis by observation. Tricuspid Valve: The tricuspid valve is normal in structure. Tricuspid valve regurgitation is trivial. Aortic Valve: The aortic valve is normal in structure. Aortic valve regurgitation is not visualized. The aortic valve is structurally normal, with no evidence of sclerosis or stenosis. Aortic valve mean gradient measures 3.0 mmHg. Pulmonic Valve: The pulmonic valve was normal in structure. Pulmonic valve regurgitation is not visualized. Pulmonic regurgitation is not visualized. Aorta: The aortic root, ascending aorta and aortic arch are all structurally normal, with no evidence of dilitation or obstruction. Venous: The inferior vena cava is normal in size with greater than 50% respiratory variability, suggesting right atrial pressure of 3 mmHg. IAS/Shunts: No atrial level shunt detected by color flow Doppler. There is no evidence of Sedonia Kitner patent foramen ovale. No ventricular septal defect is seen or detected. There is no evidence of an atrial septal defect.  LEFT VENTRICLE PLAX 2D LVIDd:         3.02 cm LVIDs:         2.08 cm LV PW:         1.50 cm LV IVS:        1.11 cm LVOT diam:     1.50 cm LV SV:         22 ml LV SV Index:   39.61 LVOT Area:     1.77 cm  RIGHT VENTRICLE RV S prime:     103.00 cm/s TAPSE (M-mode): 1.4 cm LEFT ATRIUM           Index LA diam:      2.40 cm 12.86 cm/m LA Vol (  A2C): 32.2 ml 172.49 ml/m LA Vol (A4C): 35.5 ml 190.17 ml/m  AORTIC VALVE AV Mean Grad: 3.0 mmHg LVOT Vmax:    700.00 cm/s LVOT Vmean:   300.000 cm/s  LVOT VTI:     2.780 m  AORTA Ao Root diam: 2.80 cm MV E velocity: 93.10 cm/s   103 cm/s MV Rosangelica Pevehouse velocity: 8000.00 cm/s 70.3 cm/s SHUNTS MV E/Dayannara Pascal ratio:  0.01         1.5       Systemic VTI:  2.78 m                                       Systemic Diam: 1.50 cm  Fransico Him MD Electronically signed by Fransico Him MD Signature Date/Time: 01/07/2019/12:47:24 PM    Final    VAS [place] LOWER EXTREMITY VENOUS (DVT)  Result Date: 01/08/2019  Lower Venous Study Indications: Pulmonary embolism.  Comparison Study: no prior Performing Technologist: Abram Sander RVS  Examination Guidelines: Titania Gault complete evaluation includes B-mode imaging, spectral Doppler, color Doppler, and power Doppler as needed of all accessible portions of each vessel. Bilateral testing is considered an integral part of Alexey Rhoads complete examination. Limited examinations for reoccurring indications may be performed as noted.  +---------+---------------+---------+-----------+----------+--------------+ RIGHT    CompressibilityPhasicitySpontaneityPropertiesThrombus Aging +---------+---------------+---------+-----------+----------+--------------+ CFV      Full           Yes      Yes                                 +---------+---------------+---------+-----------+----------+--------------+ SFJ      Full                                                        +---------+---------------+---------+-----------+----------+--------------+ FV Prox  Full                                                        +---------+---------------+---------+-----------+----------+--------------+ FV Mid   Full                                                        +---------+---------------+---------+-----------+----------+--------------+ FV DistalFull                                                        +---------+---------------+---------+-----------+----------+--------------+ PFV      Full                                                         +---------+---------------+---------+-----------+----------+--------------+ POP  Full           Yes      Yes                                 +---------+---------------+---------+-----------+----------+--------------+ PTV      Full                                                        +---------+---------------+---------+-----------+----------+--------------+ PERO     Full                                                        +---------+---------------+---------+-----------+----------+--------------+   +---------+---------------+---------+-----------+----------+--------------+ LEFT     CompressibilityPhasicitySpontaneityPropertiesThrombus Aging +---------+---------------+---------+-----------+----------+--------------+ CFV      Full           Yes      Yes                                 +---------+---------------+---------+-----------+----------+--------------+ SFJ      Full                                                        +---------+---------------+---------+-----------+----------+--------------+ FV Prox  Full                                                        +---------+---------------+---------+-----------+----------+--------------+ FV Mid   Full                                                        +---------+---------------+---------+-----------+----------+--------------+ FV DistalFull                                                        +---------+---------------+---------+-----------+----------+--------------+ PFV      Full                                                        +---------+---------------+---------+-----------+----------+--------------+ POP      Full           Yes      Yes                                 +---------+---------------+---------+-----------+----------+--------------+  PTV      Full                                                         +---------+---------------+---------+-----------+----------+--------------+ PERO     Full                                                        +---------+---------------+---------+-----------+----------+--------------+     Summary: Right: There is no evidence of deep vein thrombosis in the lower extremity. No cystic structure found in the popliteal fossa. Left: There is no evidence of deep vein thrombosis in the lower extremity. No cystic structure found in the popliteal fossa.  *See table(s) above for measurements and observations. Electronically signed by Harold Barban MD on 01/08/2019 at 4:44:02 PM.    Final     Microbiology: Recent Results (from the past 240 hour(s))  Blood Culture (routine x 2)     Status: None   Collection Time: 01/05/19  2:20 PM   Specimen: BLOOD  Result Value Ref Range Status   Specimen Description   Final    BLOOD LEFT ANTECUBITAL Performed at [facility], [address] [place], [place] [address]    Special Requests   Final    BOTTLES DRAWN AEROBIC ONLY Blood Culture adequate volume Performed at [facility], [address] [place], [place] [address]    Culture   Final    NO GROWTH 5 DAYS Performed at [facility], [place] [address], [place], [place] [address]    Report Status 01/10/2019 FINAL  Final  Blood Culture (routine x 2)     Status: None   Collection Time: 01/05/19  2:25 PM   Specimen: BLOOD  Result Value Ref Range Status   Specimen Description   Final    BLOOD BLOOD LEFT WRIST Performed at [facility], [address] [place], [place] [address]    Special Requests   Final    BOTTLES DRAWN AEROBIC AND ANAEROBIC Blood Culture adequate volume Performed at [facility], [address] [place], [place] [address]    Culture   Final    NO GROWTH 5 DAYS Performed at [facility], [place] [address] [place], [place] [address]    Report Status 01/10/2019 FINAL  Final  SARS Coronavirus 2 Ag  (30 min TAT) - Nasal Swab (BD Veritor Kit)     Status: None   Collection Time: 01/05/19  2:59 PM   Specimen: Nasal Swab (BD Veritor Kit)  Result Value Ref Range Status   SARS Coronavirus 2 Ag NEGATIVE NEGATIVE Final    Comment: (NOTE) SARS-CoV-2 antigen NOT DETECTED.  Negative results are presumptive.  Negative results do not preclude SARS-CoV-2 infection and should not be used as the sole basis for treatment or other patient management decisions, including infection  control decisions, particularly in the presence of clinical signs and  symptoms consistent with COVID-19, or in those who have been in contact with the virus.  Negative results must be combined with clinical observations, patient history, and epidemiological information. The expected result is Negative.  Fact Sheet for Patients: PodPark.tn Fact Sheet for Healthcare Providers: GiftContent.is This test is not yet approved or cleared by the [place] FDA and  has been authorized for detection and/or diagnosis of SARS-CoV-2 by FDA under an Emergency Use Authorization (EUA).  This EUA will remain in effect (meaning this test can be used) for the duration of  the COVID-19 de claration under Section 564(b)(1) of the Act, 21 U.S.C. section 360bbb-3(b)(1), unless the authorization is terminated or revoked sooner. Performed at [facility], [address] [place], [place] [address]   Respiratory Panel by RT PCR (Flu Marc Leichter&B, Covid) - Nasopharyngeal Swab     Status: Abnormal   Collection Time: 01/05/19  4:42 PM   Specimen: Nasopharyngeal Swab  Result Value Ref Range Status   SARS Coronavirus 2 by RT PCR POSITIVE (Mithra Spano) NEGATIVE Final    Comment: RESULT CALLED TO, READ BACK BY AND VERIFIED WITH: K NEAL RN 01/05/19 1915 JDW (NOTE) SARS-CoV-2 target nucleic acids are DETECTED. SARS-CoV-2 RNA is generally detectable in upper respiratory specimens  during the acute  phase of infection. Positive results are indicative of the presence of the identified virus, but do not rule out bacterial infection or co-infection with other pathogens not detected by the test. Clinical correlation with patient history and other diagnostic information is necessary to determine patient infection status. The expected result is Negative. Fact Sheet for Patients:  PinkCheek.be Fact Sheet for Healthcare Providers: GravelBags.it This test is not yet approved or cleared by the [place] FDA and  has been authorized for detection and/or diagnosis of SARS-CoV-2 by FDA under an Emergency Use Authorization (EUA).  This EUA will remain in effect (meaning this test can be used) for the d uration of  the COVID-19 declaration under Section 564(b)(1) of the Act, 21 U.S.C. section 360bbb-3(b)(1), unless the authorization is terminated or revoked sooner.    Influenza Ena Demary by PCR NEGATIVE NEGATIVE Final   Influenza B by PCR NEGATIVE NEGATIVE Final    Comment: (NOTE) The Xpert Xpress SARS-CoV-2/FLU/RSV assay is intended as an aid in  the diagnosis of influenza from Nasopharyngeal swab specimens and  should not be used as Tkeyah Burkman sole basis for treatment. Nasal washings and  aspirates are unacceptable for Xpert Xpress SARS-CoV-2/FLU/RSV  testing. Fact Sheet for Patients: PinkCheek.be Fact Sheet for Healthcare Providers: GravelBags.it This test is not yet approved or cleared by the [place] FDA and  has been authorized for detection and/or diagnosis of SARS-CoV-2 by  FDA under an Emergency Use Authorization (EUA). This EUA will remain  in effect (meaning this test can be used) for the duration of the  Covid-19 declaration under Section 564(b)(1) of the Act, 21  U.S.C. section 360bbb-3(b)(1), unless the authorization is  terminated or revoked. Performed at [facility], [place] [address] [place],  [address]      Labs: Basic Metabolic Panel: Recent Labs  Lab 01/06/19 0319 01/07/19 0130 01/08/19 0453 01/09/19 0500 01/10/19 0518  NA 138 140 141 142 142  K 4.2 3.9 3.6 3.9 3.8  CL 101 104 105 107 107  CO2 '22 24 23 24 23  '$ GLUCOSE 173* 135* 148* 142* 75  BUN 24* 33* 45* 40* 39*  CREATININE 0.96 0.93 0.93 0.92 0.98  CALCIUM 8.6* 9.1 8.9 8.9 9.2   Liver Function Tests: Recent Labs  Lab 01/06/19 0319 01/07/19 0130 01/08/19 0453 01/09/19 0500 01/10/19 0518  AST 41 39 '29 27 28  '$ ALT 34 36 32  32 32  ALKPHOS 107 108 96 93 87  BILITOT 1.1 0.5 0.4 0.4 0.7  PROT 6.8 7.0 6.4* 6.3* 6.6  ALBUMIN 2.7* 2.8* 2.9* 2.8* 3.2*   No results for input(s): LIPASE, AMYLASE in the last 168 hours. No results for input(s): AMMONIA in the last 168 hours. CBC: Recent Labs  Lab 01/05/19 1458 01/06/19 0319 01/07/19 0130 01/08/19 0453 01/09/19 0500 01/10/19 0518  WBC 10.8* 7.5 12.0* 9.8 9.2 14.1*  NEUTROABS 9.1*  --  9.8* 8.2* 7.1 7.9*  HGB 13.4 12.3 12.5 11.3* 11.7* 12.6  HCT 39.9 38.2 37.9 34.5* 36.1 38.9  MCV 86.2 90.1 87.9 88.0 89.1 88.8  PLT 506* 362 430* 437* 447* 424*   Cardiac Enzymes: No results for input(s): CKTOTAL, CKMB, CKMBINDEX, TROPONINI in the last 168 hours. BNP: BNP (last 3 results) Recent Labs    01/06/19 1950  BNP 108.6*    ProBNP (last 3 results) No results for input(s): PROBNP in the last 8760 hours.  CBG: Recent Labs  Lab 01/09/19 0810 01/09/19 1247 01/09/19 1733 01/10/19 0743 01/10/19 1244  GLUCAP 123* 149* 140* 80 148*       Signed:  Fayrene Helper MD.  [facility] 01/10/2019, 7:57 PM

## 2019-01-10 NOTE — TOC Progression Note (Signed)
Transition of Care [facility]) - Progression Note    Patient Details  Name: Anna Page MRN: [id number] Date of Birth: November 29, 1945  Transition of Care [facility]) CM/SW Contact  Joaquin Courts, RN Phone Number: 01/10/2019, 2:34 PM  Clinical Narrative:    CM spoke with patient. Patient set up with [employer] for home O2. AC notified to provide portable tank for dc home. CM discussed 30-day trial card for eliquis with patient. Card was printed to nurses station and Agricultural consultant was notified that patient will need to be provided with the card at dc.     Expected Discharge Plan: Home/Self Care Barriers to Discharge: No Barriers Identified  Expected Discharge Plan and Services Expected Discharge Plan: Home/Self Care   Discharge Planning Services: CM Consult Post Acute Care Choice: Durable Medical Equipment Living arrangements for the past 2 months: Single Family Home Expected Discharge Date: 01/10/19               DME Arranged: Oxygen DME Agency: [place] Date DME Agency Contacted: 01/10/19 Time DME Agency Contacted: 2119 Representative spoke with at DME Agency: Learta Codding HH Arranged: NA [place] Agency: NA         Social Determinants of Health ([place]) Interventions    Readmission Risk Interventions No flowsheet data found.

## 2019-09-21 ENCOUNTER — Encounter (HOSPITAL_BASED_OUTPATIENT_CLINIC_OR_DEPARTMENT_OTHER): Payer: Self-pay | Admitting: Emergency Medicine

## 2019-09-21 ENCOUNTER — Other Ambulatory Visit: Payer: Self-pay

## 2019-09-21 ENCOUNTER — Emergency Department (HOSPITAL_BASED_OUTPATIENT_CLINIC_OR_DEPARTMENT_OTHER)
Admission: EM | Admit: 2019-09-21 | Discharge: 2019-09-21 | Disposition: A | Discharge status: Home / Self Care | Payer: Medicare HMO | Attending: Emergency Medicine | Admitting: Emergency Medicine

## 2019-09-21 DIAGNOSIS — Z7901 Long term (current) use of anticoagulants: Secondary | ICD-10-CM | POA: Insufficient documentation

## 2019-09-21 DIAGNOSIS — R040 Epistaxis: Secondary | ICD-10-CM | POA: Insufficient documentation

## 2019-09-21 DIAGNOSIS — I10 Essential (primary) hypertension: Secondary | ICD-10-CM | POA: Diagnosis not present

## 2019-09-21 DIAGNOSIS — Z79899 Other long term (current) drug therapy: Secondary | ICD-10-CM | POA: Insufficient documentation

## 2019-09-21 MED ORDER — OXYMETAZOLINE HCL 0.05 % NA SOLN
NASAL | Status: AC
  Filled 2019-09-21: qty 30

## 2019-09-21 MED ORDER — OXYMETAZOLINE HCL 0.05 % NA SOLN: 1.0000 | Freq: Once | NASAL | Status: DC

## 2019-09-21 NOTE — ED Provider Notes (Signed)
[facility] EMERGENCY DEPARTMENT Provider Note   CSN: [id number] Arrival date & time: 09/21/19  0109     History Chief Complaint  Patient presents with  . Epistaxis    Anna Page is a 74 y.o. female.  The history is provided by the patient.  Epistaxis Location:  R nare Severity:  Mild Duration:  2 hours Timing:  Constant Progression:  Resolved Chronicity:  New Context: aspirin use   Relieved by:  Nothing Worsened by:  Nothing Ineffective treatments:  None tried Associated symptoms: no congestion, no dizziness and no fever   Associated symptoms comment:  Felt dripping down the throat this has stopped  Risk factors: no alcohol use and no head and neck surgery        Past Medical History:  Diagnosis Date  . Hypertension     Patient Active Problem List   Diagnosis Date Noted  . Acute respiratory disease due to COVID-19 virus 01/06/2019  . Hypokalemia 01/06/2019  . Acute respiratory failure with hypoxia (HCC) 01/06/2019  . Acute pulmonary embolism (HCC) 01/06/2019  . Multifocal pneumonia 01/05/2019    Past Surgical History:  Procedure Laterality Date  . BREAST SURGERY       OB History   No obstetric history on file.     Family History  Family history unknown: Yes    Social History   Tobacco Use  . Smoking status: Never Smoker  . Smokeless tobacco: Never Used  Vaping Use  . Vaping Use: Never used  Substance Use Topics  . Alcohol use: No  . Drug use: No    Home Medications Prior to Admission medications   Medication Sig Start Date End Date Taking? Authorizing Provider  Apixaban Starter Pack (ELIQUIS DVT/PE STARTER PACK) 5 MG TBPK Take as directed on package: start with two-5mg  tablets twice daily for 7 days. On day 8, switch to one-5mg  tablet twice daily. 01/10/19   Zigmund Daniel., MD  docusate sodium (COLACE) 100 MG capsule Take 100 mg by mouth daily.    [provider]  ferrous sulfate 325 (65 FE) MG tablet  Take 325 mg by mouth daily with breakfast.    [provider]  hydrochlorothiazide (HYDRODIURIL) 25 MG tablet Take 25 mg by mouth daily.    [provider]  Multiple Vitamin (MULTIVITAMIN WITH MINERALS) TABS tablet Take 1 tablet by mouth daily.    [provider]    Allergies    Prednisone  Review of Systems   Review of Systems  Constitutional: Negative for fever.  HENT: Positive for nosebleeds. Negative for congestion.   Eyes: Negative for visual disturbance.  Respiratory: Negative for shortness of breath.   Cardiovascular: Negative for chest pain.  Gastrointestinal: Negative for abdominal pain.  Genitourinary: Negative for difficulty urinating.  Musculoskeletal: Negative for arthralgias.  Neurological: Negative for dizziness.  Psychiatric/Behavioral: Negative for agitation.  All other systems reviewed and are negative.   Physical Exam Updated Vital Signs BP (!) 149/85 (BP Location: Right Arm)   Pulse 70   Temp 98.1 F (36.7 C) (Oral)   Resp 18   Ht 5' 0.5" (1.537 m)   Wt 70.3 kg   SpO2 99%   BMI 29.77 kg/m   Physical Exam Vitals and nursing note reviewed.  Constitutional:      General: She is not in acute distress.    Appearance: Normal appearance.  HENT:     Head: Normocephalic and atraumatic.     Nose: Nose normal. No rhinorrhea.  Mouth/Throat:     Mouth: Mucous membranes are moist.     Pharynx: Oropharynx is clear.  Eyes:     Conjunctiva/sclera: Conjunctivae normal.     Pupils: Pupils are equal, round, and reactive to light.  Cardiovascular:     Rate and Rhythm: Normal rate and regular rhythm.     Pulses: Normal pulses.     Heart sounds: Normal heart sounds.  Pulmonary:     Effort: Pulmonary effort is normal.     Breath sounds: Normal breath sounds.  Abdominal:     General: Abdomen is flat. Bowel sounds are normal.     Palpations: Abdomen is soft.     Tenderness: There is no abdominal tenderness. There is no guarding.    Musculoskeletal:        General: Normal range of motion.     Cervical back: Normal range of motion and neck supple.  Skin:    General: Skin is warm and dry.     Capillary Refill: Capillary refill takes less than 2 seconds.  Neurological:     General: No focal deficit present.     Mental Status: She is alert and oriented to person, place, and time.     Deep Tendon Reflexes: Reflexes normal.  Psychiatric:        Mood and Affect: Mood normal.        Behavior: Behavior normal.     ED Results / Procedures / Treatments   Labs (all labs ordered are listed, but only abnormal results are displayed) Labs Reviewed - No data to display  EKG None  Radiology No results found.  Procedures Procedures (including critical care time)  Medications Ordered in ED Medications  oxymetazoline (AFRIN) 0.05 % nasal spray 1 spray (has no administration in time range)  oxymetazoline (AFRIN) 0.05 % nasal spray (has no administration in time range)    ED Course  I have reviewed the triage vital signs and the nursing notes.  Pertinent labs & imaging results that were available during my care of the patient were reviewed by me and considered in my medical decision making (see chart for details).   Patient has stopped bleeding and has been observed.  Afrin 2 sprays BID for 2 days.     Anna Page was evaluated in Emergency Department on 09/21/2019 for the symptoms described in the history of present illness. She was evaluated in the context of the global COVID-19 pandemic, which necessitated consideration that the patient might be at risk for infection with the SARS-CoV-2 virus that causes COVID-19. Institutional protocols and algorithms that pertain to the evaluation of patients at risk for COVID-19 are in a state of rapid change based on information released by regulatory bodies including the CDC and federal and state organizations. These policies and algorithms were followed during the patient's  care in the ED.  Final Clinical Impression(s) / ED Diagnoses Return for intractable cough, coughing up blood,fevers >100.4 unrelieved by medication, shortness of breath, intractable vomiting, chest pain, shortness of breath, weakness,numbness, changes in speech, facial asymmetry,abdominal pain, passing out,Inability to tolerate liquids or food, cough, altered mental status or any concerns. No signs of systemic illness or infection. The patient is nontoxic-appearing on exam and vital signs are within normal limits.   I have reviewed the triage vital signs and the nursing notes. Pertinent labs &imaging results that were available during my care of the patient were reviewed by me and considered in my medical decision making (see chart for details).After history, exam,  and medical workup I feel the patient has beenappropriately medically screened and is safe for discharge home. Pertinent diagnoses were discussed with the patient. Patient was given return precautions.   Malaya Cagley, MD 09/21/19 [phone]

## 2019-09-21 NOTE — ED Triage Notes (Signed)
Pt is c/o nosebleed that started at 0330 this morning

## 2020-08-11 ENCOUNTER — Emergency Department (HOSPITAL_BASED_OUTPATIENT_CLINIC_OR_DEPARTMENT_OTHER)
Admission: EM | Admit: 2020-08-11 | Discharge: 2020-08-11 | Disposition: A | Discharge status: Home / Self Care | Payer: Medicare HMO | Attending: Emergency Medicine | Admitting: Emergency Medicine

## 2020-08-11 ENCOUNTER — Encounter (HOSPITAL_BASED_OUTPATIENT_CLINIC_OR_DEPARTMENT_OTHER): Payer: Self-pay

## 2020-08-11 ENCOUNTER — Other Ambulatory Visit: Payer: Self-pay

## 2020-08-11 DIAGNOSIS — I10 Essential (primary) hypertension: Secondary | ICD-10-CM | POA: Diagnosis not present

## 2020-08-11 DIAGNOSIS — R040 Epistaxis: Secondary | ICD-10-CM | POA: Insufficient documentation

## 2020-08-11 MED ORDER — OXYMETAZOLINE HCL 0.05 % NA SOLN
1.0000 | Freq: Once | NASAL | Status: AC
  Administered 2020-08-11: 1 via NASAL
  Filled 2020-08-11: qty 30

## 2020-08-11 NOTE — ED Notes (Signed)
Nose clip applied

## 2020-08-11 NOTE — ED Provider Notes (Signed)
[facility]ER[facility] DEPT [facility] Emergency Department Provider Note MRN:  [id number]  Arrival date & time: 08/11/20     Chief Complaint   Epistaxis   History of Present Illness   Anna Page is a 75 y.o. year-old female with a history of hypertension presenting to the ED with chief complaint of epistaxis.  History of intermittent nosebleeds for the past few weeks, started again this evening after bending down.  Denies any trauma, no recent upper respiratory infections, no blood thinners.  Bleeding is mild to moderate, constant, mostly coming from the right nare.  Review of Systems  A complete 10 system review of systems was obtained and all systems are negative except as noted in the HPI and PMH.   Patient's Health History    Past Medical History:  Diagnosis Date   Hypertension     Past Surgical History:  Procedure Laterality Date   BREAST SURGERY      Family History  Family history unknown: Yes    Social History   Socioeconomic History   Marital status: Widowed    Spouse name: Not on file   Number of children: Not on file   Years of education: Not on file   Highest education level: Not on file  Occupational History   Not on file  Tobacco Use   Smoking status: Never   Smokeless tobacco: Never  Vaping Use   Vaping Use: Never used  Substance and Sexual Activity   Alcohol use: No   Drug use: No   Sexual activity: Not on file  Other Topics Concern   Not on file  Social History Narrative   Not on file   Social Determinants of Health   Financial Resource Strain: Not on file  Food Insecurity: Not on file  Transportation Needs: Not on file  Physical Activity: Not on file  Stress: Not on file  Social Connections: Not on file  Intimate Partner Violence: Not on file     Physical Exam   Vitals:   08/11/20 0126 08/11/20 0131  BP: (!) 129/113 (!) 142/95  Pulse: 85   Resp: 18   Temp: 98.1 F (36.7 C)   SpO2: 99%     CONSTITUTIONAL:  Well-appearing, NAD NEURO:  Alert and oriented x 3, no focal deficits EYES:  eyes equal and reactive ENT/NECK:  no LAD, no JVD; blood within the right nare, no obvious source CARDIO: Regular rate, well-perfused, normal S1 and S2 PULM:  CTAB no wheezing or rhonchi GI/GU:  normal bowel sounds, non-distended, non-tender MSK/SPINE:  No gross deformities, no edema SKIN:  no rash, atraumatic PSYCH:  Appropriate speech and behavior  *Additional and/or pertinent findings included in MDM below  Diagnostic and Interventional Summary    EKG Interpretation  Date/Time:    Ventricular Rate:    PR Interval:    QRS Duration:   QT Interval:    QTC Calculation:   R Axis:     Text Interpretation:         Labs Reviewed - No data to display  No orders to display    Medications  oxymetazoline (AFRIN) 0.05 % nasal spray 1 spray (1 spray Each Nare Given 08/11/20 0204)     Procedures  /  Critical Care .Epistaxis Management  Date/Time: 08/11/2020 2:29 AM Performed by: Sabas Sous, MD Authorized by: Sabas Sous, MD   Consent:    Consent obtained:  Verbal   Consent given by:  Patient   Risks, benefits, and alternatives  were discussed: yes     Risks discussed:  Bleeding, infection, nasal injury and pain Universal protocol:    Procedure explained and questions answered to patient or proxy's satisfaction: yes     Patient identity confirmed:  Verbally with patient Anesthesia:    Anesthesia method:  None Procedure details:    Repair method: Nasal clamp.   Treatment complexity:  Limited   Treatment episode: initial   Post-procedure details:    Assessment:  Bleeding stopped   Procedure completion:  Tolerated well, no immediate complications  ED Course and Medical Decision Making  I have reviewed the triage vital signs, the nursing notes, and pertinent available records from the EMR.  Listed above are laboratory and imaging tests that I personally ordered, reviewed, and interpreted  and then considered in my medical decision making (see below for details).  Epistaxis, really no other symptoms, no anticoagulation, normal vital signs.  On my initial assessment patient had been wearing nasal clamp for 20 minutes, removed and patient still with some small oozing of blood from the right nare.  Had patient blow her nose and remove all clots and clamp was reapplied.  20 minutes later upon recheck, bleeding is stopped, both nares are clear of blood, no obvious source of bleeding, silver nitrate cauterization not indicated.  Bleeding now resolved, appropriate for discharge.       Elmer Sow. Pilar Plate, MD [facility] Emergency Medicine [facility] [email or web address]  Final Clinical Impressions(s) / ED Diagnoses     ICD-10-CM   1. Epistaxis  R04.0       ED Discharge Orders     None        Discharge Instructions Discussed with and Provided to Patient:     Discharge Instructions      You were evaluated in the Emergency Department and after careful evaluation, we did not find any emergent condition requiring admission or further testing in the hospital.  Your exam/testing today was overall reassuring.  Recommend use of Vaseline at home as we discussed.  Please return to the Emergency Department if you experience any worsening of your condition.  Thank you for allowing [place] to be a part of your care.         Sabas Sous, MD 08/11/20 [phone]

## 2020-08-11 NOTE — Discharge Instructions (Addendum)
You were evaluated in the Emergency Department and after careful evaluation, we did not find any emergent condition requiring admission or further testing in the hospital.  Your exam/testing today was overall reassuring.  Recommend use of Vaseline at home as we discussed.  Please return to the Emergency Department if you experience any worsening of your condition.  Thank you for allowing [place] to be a part of your care.

## 2020-08-11 NOTE — ED Triage Notes (Signed)
Recurrent nosebleed from right nare since 2230 last night. Denies anticoagulants.

## 2021-08-15 IMAGING — CT CT ANGIO CHEST
1 of 4 series · 12 of 30 positions shown · IV contrast (OMNIPAQUE)
Comparison: Radiography 01/05/2019

CLINICAL DATA: Shortness of breath.  Coronavirus infection.

EXAM:
CT ANGIOGRAPHY CHEST WITH CONTRAST
TECHNIQUE: Multidetector CT imaging of the chest was performed using the
standard protocol during bolus administration of intravenous
contrast. Multiplanar CT image reconstructions and MIPs were
obtained to evaluate the vascular anatomy.
CONTRAST:  75mL OMNIPAQUE IOHEXOL 350 MG/ML SOLN

[Series 4: pe chest · axial · 0.67mm/px · z∈[-544,-294]mm · 12 of 151 slices shown]
[im 13/151  lung]
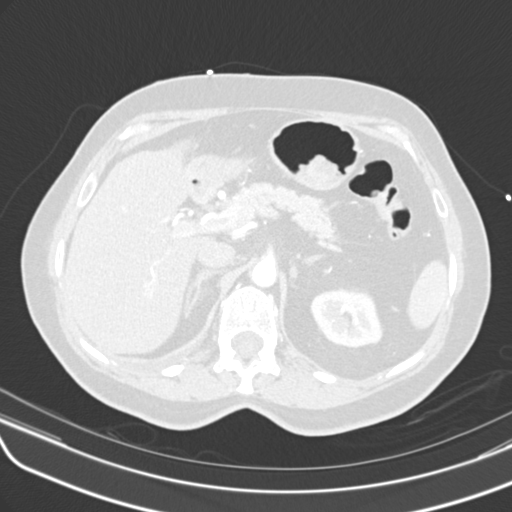
[im 26/151  mediastinal]
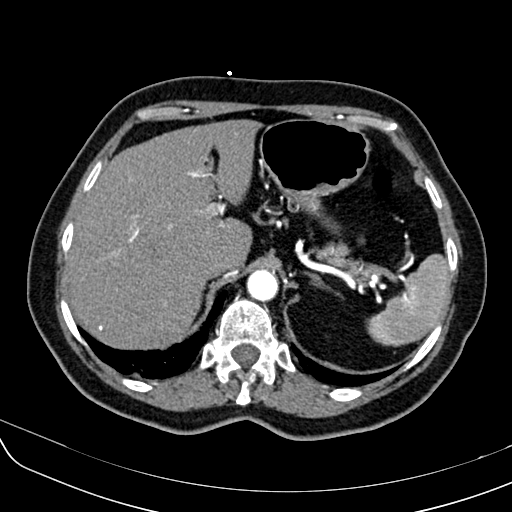
[im 38/151  lung]
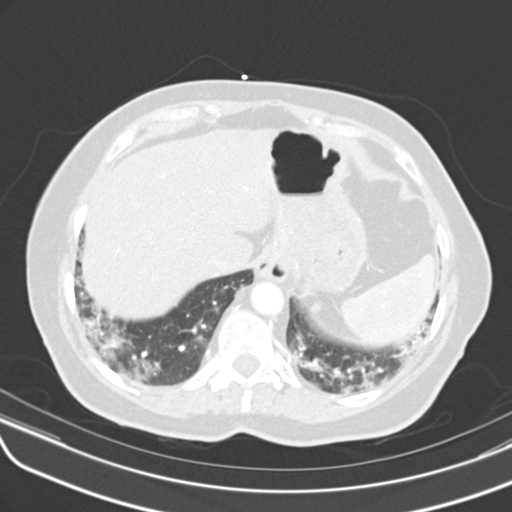
[im 51/151  mediastinal]
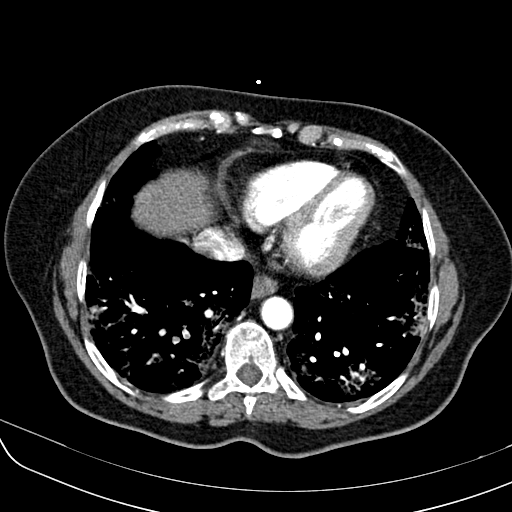
[im 63/151  lung]
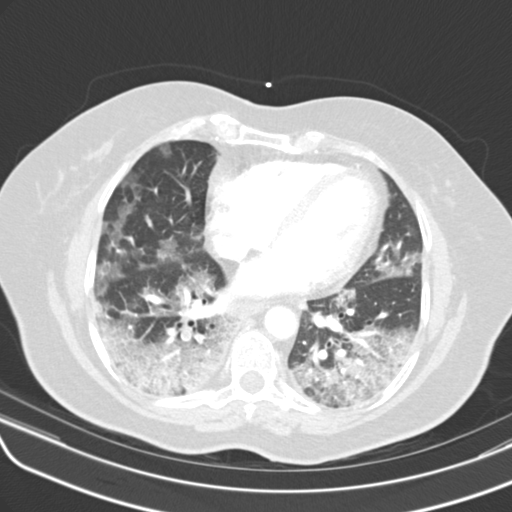
[im 72/151  mediastinal]
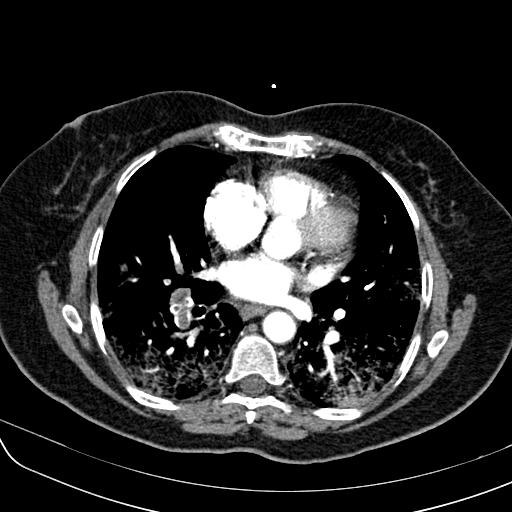
[im 76/151  lung]
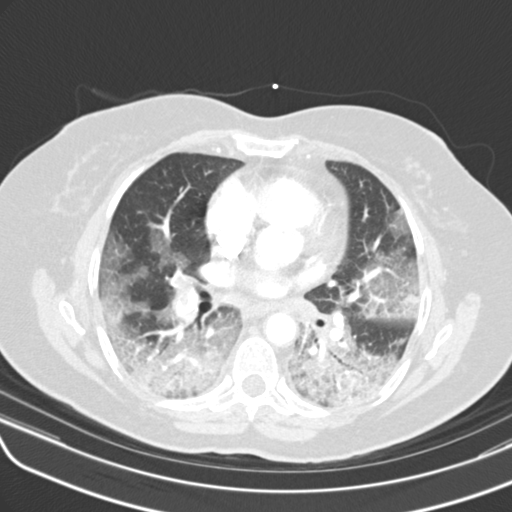
[im 88/151  mediastinal]
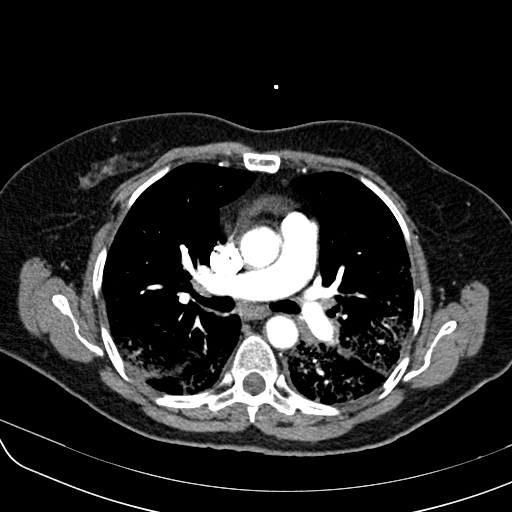
[im 101/151  lung]
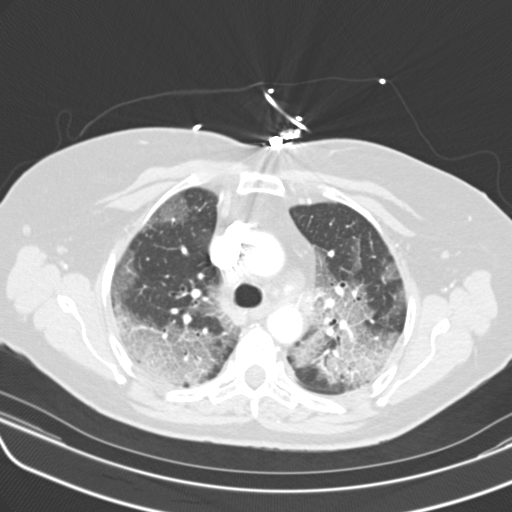
[im 113/151  mediastinal]
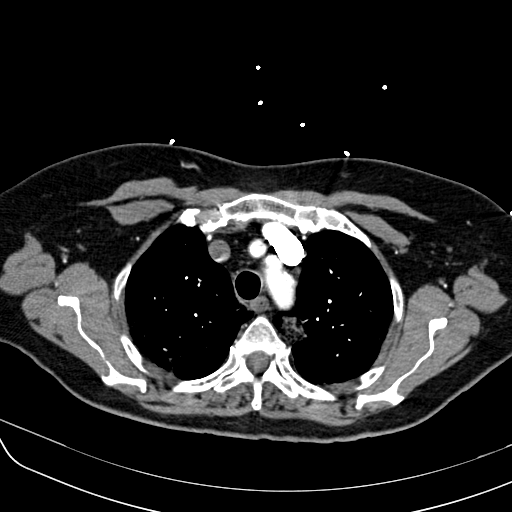
[im 126/151  lung]
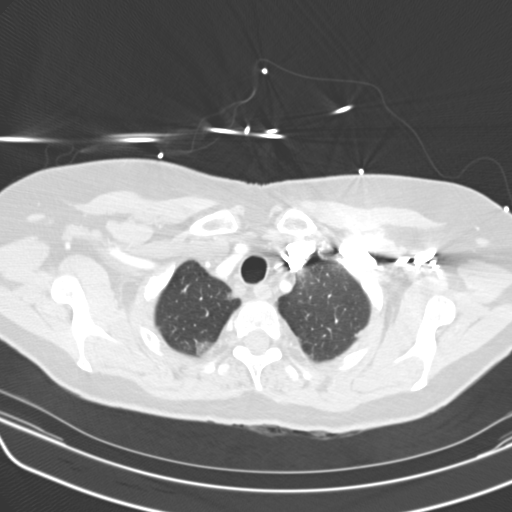
[im 138/151  mediastinal]
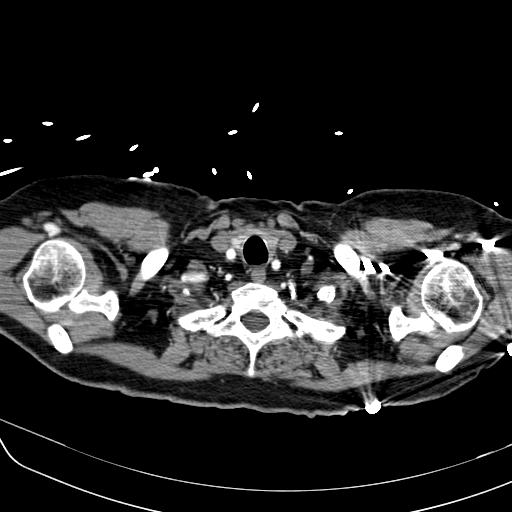

[12 of 30 positions shown; findings below may reference images not displayed]

FINDINGS: Cardiovascular: Pulmonary arterial opacification is good. Large
pulmonary emboli on the right affecting the right middle and lower
lobe pulmonary arteries. No emboli seen on the left. Right
ventricular to left ventricular ratio is 1.

Mediastinum/Nodes: No mediastinal or hilar mass or lymphadenopathy.

Lungs/Pleura: Widespread bilateral patchy pulmonary infiltrates
consistent with viral pneumonia. No pleural effusion or lobar
collapse.

Upper Abdomen: Stones and or sludge in the gallbladder. Cyst of the
left lobe of the liver. Otherwise negative.

Musculoskeletal: Negative

Review of the MIP images confirms the above findings.
IMPRESSION: Positive for pulmonary emboli in the right middle and lower lobe
pulmonary arteries. Right ventricular to left ventricular ratio is
1. evidence of right heart strain consistent with at least
submassive (intermediate risk) PE. This is only slightly above the
normal limits of 0.9. The presence of right heart strain has been
associated with an increased risk of morbidity and mortality. Please
see [REDACTED] order set.

Widespread bilateral patchy pulmonary infiltrates consistent with
viral pneumonia as stated clinically.

Call report in progress.
